# Patient Record
Sex: Male | Born: 1980 | Race: White | Hispanic: No | Marital: Single | Smoking: Current every day smoker
Health system: Southern US, Community
[De-identification: ages and names within clinical notes are randomized; demographics above are authoritative.]

## PROBLEM LIST (undated history)

## (undated) DIAGNOSIS — M5020 Other cervical disc displacement, unspecified cervical region: Secondary | ICD-10-CM

## (undated) DIAGNOSIS — F419 Anxiety disorder, unspecified: Secondary | ICD-10-CM

## (undated) HISTORY — DX: Anxiety disorder, unspecified: F41.9

## (undated) HISTORY — PX: CERVICAL FUSION: SHX112

## (undated) HISTORY — PX: SPINE SURGERY: SHX786

---

## 2012-12-21 ENCOUNTER — Ambulatory Visit: Payer: Self-pay | Admitting: Family Medicine

## 2012-12-21 LAB — URINALYSIS, COMPLETE
Blood: NEGATIVE
Glucose,UR: NEGATIVE mg/dL (ref 0–75)
Ketone: NEGATIVE
Ph: 8.5 (ref 4.5–8.0)
Protein: NEGATIVE
Specific Gravity: 1.01 (ref 1.003–1.030)

## 2012-12-21 LAB — COMPREHENSIVE METABOLIC PANEL
Albumin: 4.8 g/dL (ref 3.4–5.0)
Alkaline Phosphatase: 55 U/L (ref 50–136)
Anion Gap: 16 (ref 7–16)
Bilirubin,Total: 1 mg/dL (ref 0.2–1.0)
Chloride: 102 mmol/L (ref 98–107)
Co2: 22 mmol/L (ref 21–32)
Creatinine: 1.13 mg/dL (ref 0.60–1.30)
EGFR (African American): >60
Glucose: 108 mg/dL — ABNORMAL HIGH (ref 65–99)
Osmolality: 280 (ref 275–301)
Potassium: 3.5 mmol/L (ref 3.5–5.1)
SGOT(AST): 20 U/L (ref 15–37)
SGPT (ALT): 24 U/L (ref 12–78)
Total Protein: 7.9 g/dL (ref 6.4–8.2)

## 2012-12-21 LAB — CBC WITH DIFFERENTIAL/PLATELET
Basophil #: 0 10*3/uL (ref 0.0–0.1)
Basophil %: 0.2 %
Eosinophil #: 0 10*3/uL (ref 0.0–0.7)
Eosinophil %: 0.3 %
Lymphocyte #: 0.6 10*3/uL — ABNORMAL LOW (ref 1.0–3.6)
MCH: 32.6 pg (ref 26.0–34.0)
MCHC: 35.1 g/dL (ref 32.0–36.0)
MCV: 93 fL (ref 80–100)
Monocyte #: 0.4 x10 3/mm (ref 0.2–1.0)
RBC: 5.2 10*6/uL (ref 4.40–5.90)
RDW: 12.9 % (ref 11.5–14.5)
WBC: 8.4 10*3/uL (ref 3.8–10.6)

## 2015-04-02 DIAGNOSIS — M47812 Spondylosis without myelopathy or radiculopathy, cervical region: Secondary | ICD-10-CM | POA: Insufficient documentation

## 2015-06-20 DIAGNOSIS — S02402A Zygomatic fracture, unspecified, initial encounter for closed fracture: Secondary | ICD-10-CM | POA: Insufficient documentation

## 2015-12-03 ENCOUNTER — Other Ambulatory Visit: Payer: Self-pay | Admitting: Neurosurgery

## 2015-12-03 ENCOUNTER — Ambulatory Visit
Admission: RE | Admit: 2015-12-03 | Discharge: 2015-12-03 | Disposition: A | Discharge status: Home / Self Care | Payer: Self-pay | Source: Ambulatory Visit | Attending: Neurosurgery | Admitting: Neurosurgery

## 2015-12-03 DIAGNOSIS — Y92009 Unspecified place in unspecified non-institutional (private) residence as the place of occurrence of the external cause: Principal | ICD-10-CM

## 2015-12-03 DIAGNOSIS — W19XXXS Unspecified fall, sequela: Secondary | ICD-10-CM

## 2015-12-26 ENCOUNTER — Ambulatory Visit
Admission: EM | Admit: 2015-12-26 | Discharge: 2015-12-26 | Disposition: A | Discharge status: Home / Self Care | Payer: 59

## 2015-12-26 DIAGNOSIS — W57XXXA Bitten or stung by nonvenomous insect and other nonvenomous arthropods, initial encounter: Secondary | ICD-10-CM | POA: Diagnosis not present

## 2015-12-26 DIAGNOSIS — L03317 Cellulitis of buttock: Secondary | ICD-10-CM

## 2015-12-26 DIAGNOSIS — T148 Other injury of unspecified body region: Secondary | ICD-10-CM | POA: Diagnosis not present

## 2015-12-26 DIAGNOSIS — T07XXXA Unspecified multiple injuries, initial encounter: Secondary | ICD-10-CM

## 2015-12-26 MED ORDER — CEPHALEXIN 250 MG PO CAPS: 250.0000 mg | ORAL_CAPSULE | Freq: Four times a day (QID) | ORAL | 0 refills | Status: DC

## 2015-12-26 MED ORDER — SULFAMETHOXAZOLE-TRIMETHOPRIM 800-160 MG PO TABS
1.0000 | ORAL_TABLET | Freq: Two times a day (BID) | ORAL | 0 refills | Status: DC
Start: 2015-12-26 — End: 2022-03-05

## 2015-12-26 MED ORDER — MUPIROCIN CALCIUM 2 % EX CREA: 1.0000 "application " | TOPICAL_CREAM | Freq: Two times a day (BID) | CUTANEOUS | 0 refills | Status: DC

## 2015-12-26 NOTE — ED Provider Notes (Signed)
CSN: [id number]     Arrival date & time 12/26/15  1346 History   None    Chief Complaint  Patient presents with  . Insect Bite    right buttock   (Consider location/radiation/quality/duration/timing/severity/associated sxs/prior Treatment) 35 yr old white male presents to Er with 1 day hx of possible bug bite unknown cause. Pt states he noticed white bump to right buttock last night. Denies fever, chills. + mild itching to area. No treatment PTA. Pt requesting work note.    The history is provided by the patient. No language interpreter was used.    History reviewed. No pertinent past medical history. Past Surgical History:  Procedure Laterality Date  . CERVICAL FUSION     C6-C7   History reviewed. No pertinent family history. Social History  Substance Use Topics  . Smoking status: Never Smoker  . Smokeless tobacco: Never Used  . Alcohol use No    Review of Systems  Constitutional: Negative.   HENT: Negative.   Eyes: Negative.   Respiratory: Negative.   Cardiovascular: Negative.   Gastrointestinal: Negative.   Endocrine: Negative.   Musculoskeletal: Negative.   Skin: Positive for color change and wound. Negative for pallor and rash.  Allergic/Immunologic: Negative.   Neurological: Negative.   Psychiatric/Behavioral: Negative.   All other systems reviewed and are negative.   Allergies  Review of patient's allergies indicates no known allergies.  Home Medications   Prior to Admission medications   Medication Sig Start Date End Date Taking? Authorizing Provider  diazepam (VALIUM) 10 MG tablet Take 10 mg by mouth every 6 (six) hours as needed for anxiety.   Yes Historical Provider, MD  cephALEXin (KEFLEX) 250 MG capsule Take 1 capsule (250 mg total) by mouth 4 (four) times daily. 12/26/15   Clancy Gourd, NP  mupirocin cream (BACTROBAN) 2 % Apply 1 application topically 2 (two) times daily. 12/26/15   Clancy Gourd, NP  oxyCODONE-acetaminophen (PERCOCET) 10-325  MG tablet Take 1 tablet by mouth every 4 (four) hours as needed for pain.    Historical Provider, MD  sulfamethoxazole-trimethoprim (BACTRIM DS,SEPTRA DS) 800-160 MG tablet Take 1 tablet by mouth 2 (two) times daily. 12/26/15   Clancy Gourd, NP   Meds Ordered and Administered this Visit  Medications - No data to display  BP (!) 128/91 (BP Location: Left Arm)   Pulse 65   Temp 98.4 F (36.9 C) (Oral)   Resp 18   Ht  (1.854 m)   Wt 185 lb (83.9 kg)   SpO2 99%   BMI 24.41 kg/m  No data found.   Physical Exam  Constitutional: He appears well-developed and well-nourished. He is cooperative.  Visualized right buttock with chaperone assist by Celso Sickle.   HENT:  Head: Normocephalic.  Eyes: Conjunctivae and lids are normal. Pupils are equal, round, and reactive to light.  Neck: Trachea normal.  Cardiovascular: Normal rate, regular rhythm, normal heart sounds and normal pulses.   Pulmonary/Chest: Effort normal.  Abdominal: Normal appearance.  Neurological: He is alert. GCS eye subscore is 4. GCS verbal subscore is 5. GCS motor subscore is 6.  Skin: Skin is warm and dry. Capillary refill takes less than 2 seconds. Ecchymosis noted.     Psychiatric: He has a normal mood and affect. His speech is normal and behavior is normal.  Nursing note and vitals reviewed.   Urgent Care Course   Clinical Course    Procedures (including critical care time)  Labs Review Labs Reviewed - No  data to display  Imaging Review No results found.       MDM  DDX: spider bite, folliculitis, trauma.   Discussed with pt hx of smoking 1/2ppd of cigarettes, pt does not wish to stop at this time. Pt also has mildly elevated blood pressure(may be d/t discomfort of insect bite or UC visit), recommend recheck with PCP in 2 days for wound and BP. Pt verbalized understanding to this provider.    1. Insect bite   2. Multiple contusions   3. Cellulitis of buttock        Clancy GourdJeanette Dare Sanger,  NP 12/26/15 1604

## 2015-12-26 NOTE — Discharge Instructions (Signed)
Use medication as directed. Follow up in 2 days for recheck with your PCP or this UC. Return as needed.

## 2015-12-26 NOTE — ED Triage Notes (Signed)
Patient found a bug bit on his right buttock yesterday morning. Complains of itching and discomfort.

## 2017-06-03 ENCOUNTER — Ambulatory Visit
Admission: EM | Admit: 2017-06-03 | Discharge: 2017-06-03 | Disposition: A | Discharge status: Home / Self Care | Payer: 59 | Attending: Family Medicine | Admitting: Family Medicine

## 2017-06-03 ENCOUNTER — Other Ambulatory Visit: Payer: Self-pay

## 2017-06-03 ENCOUNTER — Encounter: Payer: Self-pay | Admitting: *Deleted

## 2017-06-03 DIAGNOSIS — R079 Chest pain, unspecified: Secondary | ICD-10-CM | POA: Insufficient documentation

## 2017-06-03 DIAGNOSIS — M940 Chondrocostal junction syndrome [Tietze]: Secondary | ICD-10-CM | POA: Diagnosis not present

## 2017-06-03 NOTE — ED Triage Notes (Signed)
Patient started having sudden severe left shoulder pain that radiated down into his left chest this am. Pain subsided 1.5 hours later and is now better.

## 2017-06-06 ENCOUNTER — Telehealth: Payer: Self-pay

## 2017-06-06 NOTE — Telephone Encounter (Signed)
Called to follow up with patient since visit here at [facility]. Patient instructed to call back with any questions or concerns. MAH  

## 2017-10-14 NOTE — ED Provider Notes (Signed)
[facility]    CSN: [id number] Arrival date & time: 06/03/17  1342     History   Chief Complaint Chief Complaint  Patient presents with  . Shoulder Pain    HPI Chad Duncan is a 37 y.o. male.   37 yo male with a c/o left shoulder pain that seems to radiate down into his chest. Denies any fall, injuries, trauma, shortness of breath, fevers, chills.   The history is provided by the patient.  Shoulder Pain    History reviewed. No pertinent past medical history.  There are no active problems to display for this patient.   Past Surgical History:  Procedure Laterality Date  . CERVICAL FUSION     C6-C7       Home Medications    Prior to Admission medications   Medication Sig Start Date End Date Taking? Authorizing Provider  cephALEXin (KEFLEX) 250 MG capsule Take 1 capsule (250 mg total) by mouth 4 (four) times daily. 12/26/15   Defelice, Para March, NP  diazepam (VALIUM) 10 MG tablet Take 10 mg by mouth every 6 (six) hours as needed for anxiety.    [provider]  mupirocin cream (BACTROBAN) 2 % Apply 1 application topically 2 (two) times daily. 12/26/15   Defelice, Para March, NP  oxyCODONE-acetaminophen (PERCOCET) 10-325 MG tablet Take 1 tablet by mouth every 4 (four) hours as needed for pain.    [provider]  sulfamethoxazole-trimethoprim (BACTRIM DS,SEPTRA DS) 800-160 MG tablet Take 1 tablet by mouth 2 (two) times daily. 12/26/15   Defelice, Para March, NP    Family History Family History  Problem Relation Age of Onset  . Healthy Mother   . Hypertension Father     Social History Social History   Tobacco Use  . Smoking status: Current Every Day Smoker    Packs/day: 0.50    Types: Cigarettes  . Smokeless tobacco: Never Used  Substance Use Topics  . Alcohol use: No  . Drug use: No     Allergies   Patient has no known allergies.   Review of Systems Review of Systems   Physical Exam Triage Vital Signs ED Triage Vitals    Enc Vitals Group     BP 06/03/17 1400 (!) 145/96     Pulse Rate 06/03/17 1400 68     Resp 06/03/17 1400 16     Temp 06/03/17 1400 98.9 F (37.2 C)     Temp Source 06/03/17 1400 Oral     SpO2 06/03/17 1400 100 %     Weight 06/03/17 1401 185 lb (83.9 kg)     Height 06/03/17 1401  (1.854 m)     Head Circumference --      Peak Flow --      Pain Score 06/03/17 1401 0     Pain Loc --      Pain Edu? --      Excl. in GC? --    No data found.  Updated Vital Signs BP (!) 145/96 (BP Location: Right Arm)   Pulse 68   Temp 98.9 F (37.2 C) (Oral)   Resp 16   Ht  (1.854 m)   Wt 185 lb (83.9 kg)   SpO2 100%   BMI 24.41 kg/m   Visual Acuity Right Eye Distance:   Left Eye Distance:   Bilateral Distance:    Right Eye Near:   Left Eye Near:    Bilateral Near:     Physical Exam  Constitutional: He  appears well-developed and well-nourished. No distress.  HENT:  Head: Normocephalic and atraumatic.  Right Ear: Tympanic membrane, external ear and ear canal normal.  Left Ear: Tympanic membrane, external ear and ear canal normal.  Nose: Nose normal.  Mouth/Throat: Uvula is midline, oropharynx is clear and moist and mucous membranes are normal. No oropharyngeal exudate or tonsillar abscesses.  Eyes: Pupils are equal, round, and reactive to light. Conjunctivae and EOM are normal. Right eye exhibits no discharge. Left eye exhibits no discharge. No scleral icterus.  Neck: Normal range of motion. Neck supple. No tracheal deviation present. No thyromegaly present.  Cardiovascular: Normal rate, regular rhythm and normal heart sounds.  Pulmonary/Chest: Effort normal and breath sounds normal. No stridor. No respiratory distress. He has no wheezes. He has no rales. He exhibits tenderness (reproducible, left upper chest).  Lymphadenopathy:    He has no cervical adenopathy.  Neurological: He is alert.  Skin: Skin is warm and dry. No rash noted. He is not diaphoretic.  Nursing note and  vitals reviewed.    UC Treatments / Results  Labs (all labs ordered are listed, but only abnormal results are displayed) Labs Reviewed - No data to display  EKG None  Radiology No results found.  Procedures ED EKG Date/Time: 10/14/2017 5:10 PM Performed by: Payton Mccallum, MD Authorized by: Payton Mccallum, MD   ECG reviewed by ED Physician in the absence of a cardiologist: yes   Previous ECG:    Previous ECG:  Unavailable Interpretation:    Interpretation: normal   Rate:    ECG rate assessment: normal   Rhythm:    Rhythm: sinus rhythm   Ectopy:    Ectopy: none   QRS:    QRS axis:  Normal Conduction:    Conduction: normal   ST segments:    ST segments:  Normal T waves:    T waves: normal     (including critical care time)  Medications Ordered in UC Medications - No data to display  Initial Impression / Assessment and Plan / UC Course  I have reviewed the triage vital signs and the nursing notes.  Pertinent labs & imaging results that were available during my care of the patient were reviewed by me and considered in my medical decision making (see chart for details).      Final Clinical Impressions(s) / UC Diagnoses   Final diagnoses:  Costochondritis   Discharge Instructions   None    ED Prescriptions    None     1. diagnosis reviewed with patient 2 Recommend supportive treatment with rest, ice, otc analgesics prn 3. Follow-up prn if symptoms worsen or don't improve   Controlled Substance Prescriptions [place] Controlled Substance Registry consulted? Not Applicable   Payton Mccallum, MD 10/14/17 [phone]

## 2019-03-21 ENCOUNTER — Ambulatory Visit (INDEPENDENT_AMBULATORY_CARE_PROVIDER_SITE_OTHER): Payer: 59

## 2019-03-21 ENCOUNTER — Encounter: Payer: Self-pay | Admitting: Emergency Medicine

## 2019-03-21 ENCOUNTER — Other Ambulatory Visit: Payer: Self-pay

## 2019-03-21 ENCOUNTER — Ambulatory Visit
Admission: EM | Admit: 2019-03-21 | Discharge: 2019-03-21 | Disposition: A | Discharge status: Home / Self Care | Payer: 59 | Attending: Family Medicine | Admitting: Family Medicine

## 2019-03-21 DIAGNOSIS — M542 Cervicalgia: Secondary | ICD-10-CM

## 2019-03-21 DIAGNOSIS — S161XXA Strain of muscle, fascia and tendon at neck level, initial encounter: Secondary | ICD-10-CM | POA: Diagnosis not present

## 2019-03-21 DIAGNOSIS — X500XXA Overexertion from strenuous movement or load, initial encounter: Secondary | ICD-10-CM | POA: Diagnosis not present

## 2019-03-21 HISTORY — DX: Other cervical disc displacement, unspecified cervical region: M50.20

## 2019-03-21 MED ORDER — BACLOFEN 5 MG PO TABS: 5.0000 mg | ORAL_TABLET | Freq: Three times a day (TID) | ORAL | 0 refills | Status: DC

## 2019-03-21 MED ORDER — HYDROCODONE-ACETAMINOPHEN 5-325 MG PO TABS: ORAL_TABLET | ORAL | 0 refills | Status: DC

## 2019-03-21 NOTE — ED Provider Notes (Signed)
[facility]    CSN: [id number] Arrival date & time: 03/21/19  1300      History   Chief Complaint Chief Complaint  Patient presents with  . Neck Pain    HPI Chad Duncan is a 38 y.o. male.   38 yo male with a c/o sudden neck pain this morning when he was moving his neck. States he was typing on his keyboard and looked up at the computer screen when he felt a sudden sharp shooting pain. States he has a h/o herniated cervical discs and has had a C6-7 cervical fusion. Patient states he has an appointment later this week with his spine specialist. Denies any numbness/tingling, weakness.    Neck Pain   Past Medical History:  Diagnosis Date  . Herniated disc, cervical     There are no active problems to display for this patient.   Past Surgical History:  Procedure Laterality Date  . CERVICAL FUSION     C6-C7       Home Medications    Prior to Admission medications   Medication Sig Start Date End Date Taking? Authorizing Provider  baclofen 5 MG TABS Take 5 mg by mouth 3 (three) times daily. 03/21/19   Payton Mccallum, MD  cephALEXin (KEFLEX) 250 MG capsule Take 1 capsule (250 mg total) by mouth 4 (four) times daily. 12/26/15   Defelice, Para March, NP  diazepam (VALIUM) 10 MG tablet Take 10 mg by mouth every 6 (six) hours as needed for anxiety.    [provider]  HYDROcodone-acetaminophen (NORCO/VICODIN) 5-325 MG tablet 1-2 tabs po bid prn 03/21/19   Payton Mccallum, MD  mupirocin cream (BACTROBAN) 2 % Apply 1 application topically 2 (two) times daily. 12/26/15   Defelice, Para March, NP  oxyCODONE-acetaminophen (PERCOCET) 10-325 MG tablet Take 1 tablet by mouth every 4 (four) hours as needed for pain.    [provider]  sulfamethoxazole-trimethoprim (BACTRIM DS,SEPTRA DS) 800-160 MG tablet Take 1 tablet by mouth 2 (two) times daily. 12/26/15   Defelice, Para March, NP    Family History Family History  Problem Relation Age of Onset  . Healthy Mother    . Hypertension Father     Social History Social History   Tobacco Use  . Smoking status: Current Every Day Smoker    Packs/day: 0.50    Years: 18.00    Pack years: 9.00    Types: Cigarettes  . Smokeless tobacco: Never Used  Substance Use Topics  . Alcohol use: No  . Drug use: No     Allergies   Patient has no known allergies.   Review of Systems Review of Systems  Musculoskeletal: Positive for neck pain.     Physical Exam Triage Vital Signs ED Triage Vitals  Enc Vitals Group     BP 03/21/19 1326 (!) 177/107     Pulse Rate 03/21/19 1326 64     Resp 03/21/19 1326 18     Temp 03/21/19 1326 98.1 F (36.7 C)     Temp Source 03/21/19 1326 Oral     SpO2 03/21/19 1326 100 %     Weight 03/21/19 1326 185 lb (83.9 kg)     Height 03/21/19 1326 6\' 1"  (1.854 m)     Head Circumference --      Peak Flow --      Pain Score 03/21/19 1324 10     Pain Loc --      Pain Edu? --      Excl. in  GC? --    No data found.  Updated Vital Signs BP (!) 177/107 (BP Location: Right Arm)   Pulse 64   Temp 98.1 F (36.7 C) (Oral)   Resp 18   Ht 6\' 1"  (1.854 m)   Wt 83.9 kg   SpO2 100%   BMI 24.41 kg/m   Visual Acuity Right Eye Distance:   Left Eye Distance:   Bilateral Distance:    Right Eye Near:   Left Eye Near:    Bilateral Near:     Physical Exam Vitals signs and nursing note reviewed.  Constitutional:      General: He is not in acute distress.    Appearance: He is not toxic-appearing or diaphoretic.  Neck:     Musculoskeletal: Muscular tenderness (over the cervical paraspinous muscles (left greater than right)) present.  Neurological:     General: No focal deficit present.     Mental Status: He is alert.      UC Treatments / Results  Labs (all labs ordered are listed, but only abnormal results are displayed) Labs Reviewed - No data to display  EKG   Radiology Dg Cervical Spine Complete  Result Date: 03/21/2019 CLINICAL DATA:  Pain, previous fusion  EXAM: CERVICAL SPINE - COMPLETE 4+ VIEW COMPARISON:  Radiograph 2017 FINDINGS: The patient is status post ACDF at C6-C7. No periprosthetic lucency or fracture is identified. Disc height loss with anterior osteophytes is seen at C5-C6. This does not appear significantly changed from the prior exam of 2017. No prevertebral soft tissue swelling. IMPRESSION: ACDF at C6-C7 without hardware complication. 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 Electronically Signed   By: Prudencio Pair M.D.   On: 03/21/2019 14:38    Procedures Procedures (including critical care time)  Medications Ordered in UC Medications - No data to display  Initial Impression / Assessment and Plan / UC Course  I have reviewed the triage vital signs and the nursing notes.  Pertinent labs & imaging results that were available during my care of the patient were reviewed by me and considered in my medical decision making (see chart for details).     Final Clinical Impressions(s) / UC Diagnoses   Final diagnoses:  Acute strain of neck muscle, initial encounter  Neck pain     Discharge Instructions     Heat to area Follow up with spine specialist this week as scheduled    ED Prescriptions    Medication Sig Dispense Auth. Provider   baclofen 5 MG TABS Take 5 mg by mouth 3 (three) times daily. 15 tablet Norval Gable, MD   HYDROcodone-acetaminophen (NORCO/VICODIN) 5-325 MG tablet 1-2 tabs po bid prn 10 tablet Norval Gable, MD      1. x-ray results and diagnosis reviewed with patient 2. rx as per orders above; reviewed possible side effects, interactions, risks and benefits  3. Recommend supportive treatment as above 4. Follow-up prn if symptoms worsen or don't improve  I have reviewed the PDMP during this encounter.   Norval Gable, MD 03/21/19 [phone]

## 2019-03-21 NOTE — Discharge Instructions (Signed)
Heat to area Follow up with spine specialist this week as scheduled

## 2019-03-21 NOTE — ED Triage Notes (Signed)
Patient in today c/o neck pain since this morning. Patient has had cervical fusion at C6, C7. No injury noted this morning. Patient went from typing on his keyboard and then looked up at the screen and had extreme pain. Patient has appointment with his surgeon on 03/24/19.

## 2019-03-28 ENCOUNTER — Other Ambulatory Visit: Payer: Self-pay | Admitting: Neurosurgery

## 2019-03-28 ENCOUNTER — Telehealth: Payer: Self-pay | Admitting: *Deleted

## 2019-03-28 DIAGNOSIS — M542 Cervicalgia: Secondary | ICD-10-CM

## 2019-04-07 ENCOUNTER — Ambulatory Visit
Admission: RE | Admit: 2019-04-07 | Discharge: 2019-04-07 | Disposition: A | Discharge status: Home / Self Care | Payer: 59 | Source: Ambulatory Visit | Attending: Neurosurgery | Admitting: Neurosurgery

## 2019-04-07 ENCOUNTER — Other Ambulatory Visit: Payer: Self-pay

## 2019-04-07 DIAGNOSIS — M542 Cervicalgia: Secondary | ICD-10-CM

## 2019-06-21 ENCOUNTER — Ambulatory Visit: Payer: Self-pay | Attending: Internal Medicine

## 2019-06-21 DIAGNOSIS — Z20822 Contact with and (suspected) exposure to covid-19: Secondary | ICD-10-CM | POA: Insufficient documentation

## 2019-06-22 LAB — NOVEL CORONAVIRUS, NAA: SARS-CoV-2, NAA: NOT DETECTED

## 2020-08-05 IMAGING — CT CT CERVICAL SPINE W/O CM
3 of 5 series · 11 of 33 positions shown, 13 images · non-contrast
Comparison: Face CT 12/03/2015. Cervical spine radiographs
12/03/2015.

CLINICAL DATA: 38-year-old male with prior cervical fusion.
Recurrent pain x3 weeks radiating down the left side of the
shoulder. Neck tightness.

EXAM:
CT CERVICAL SPINE WITHOUT CONTRAST
TECHNIQUE: Multidetector CT imaging of the cervical spine was performed without
intravenous contrast. Multiplanar CT image reconstructions were also
generated.

[Series 2: axial bone c-spine 2.00 · axial · 0.38mm/px · z∈[-669,-563]mm · 3 of 107 slices shown, 4 images]
[im 27/107  soft-tissue]
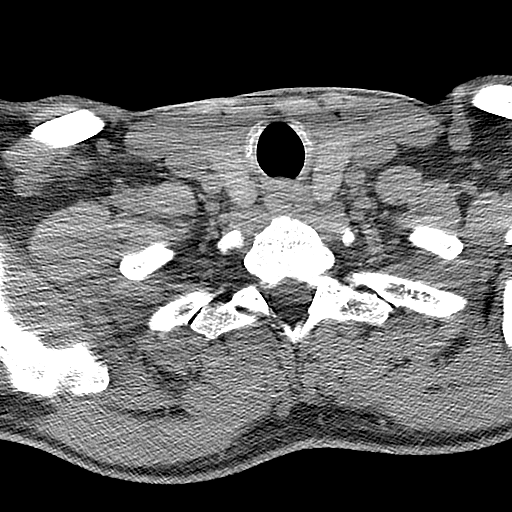
[im 27/107  bone]
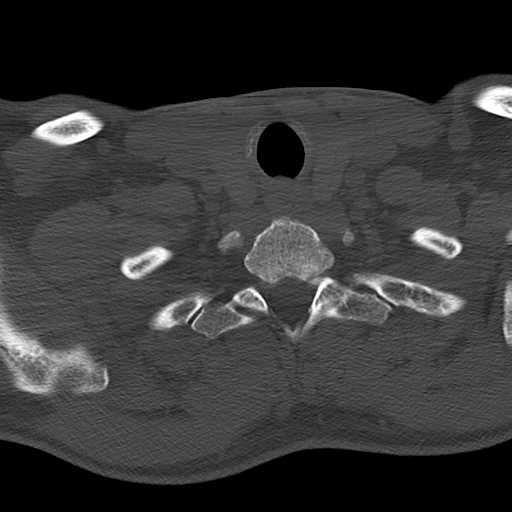
[im 54/107  bone]
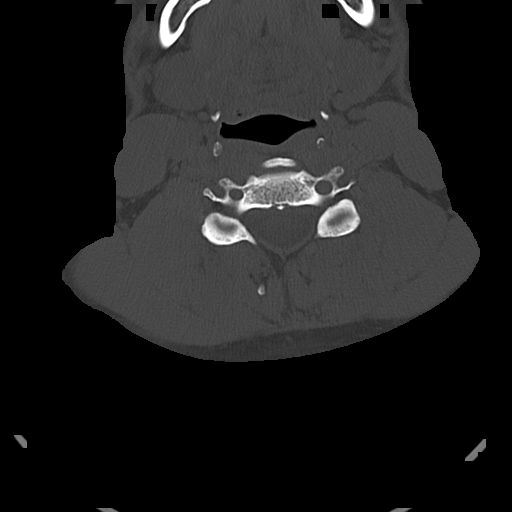
[im 80/107  bone]
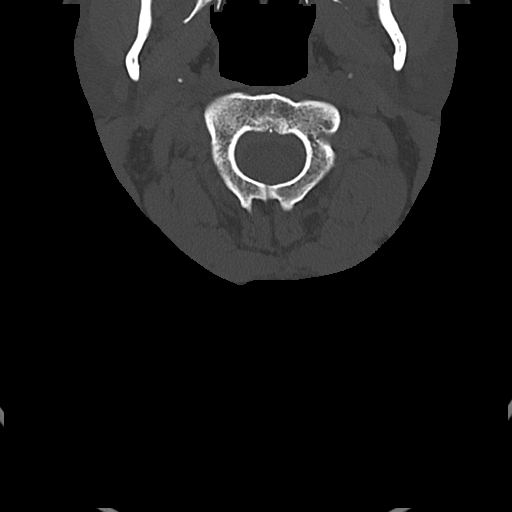

[Series 4: sag bone c-spine 2.00 sag · sagittal · 0.27mm/px · 5 of 98 slices shown, 6 images]
[im 33/98  bone]
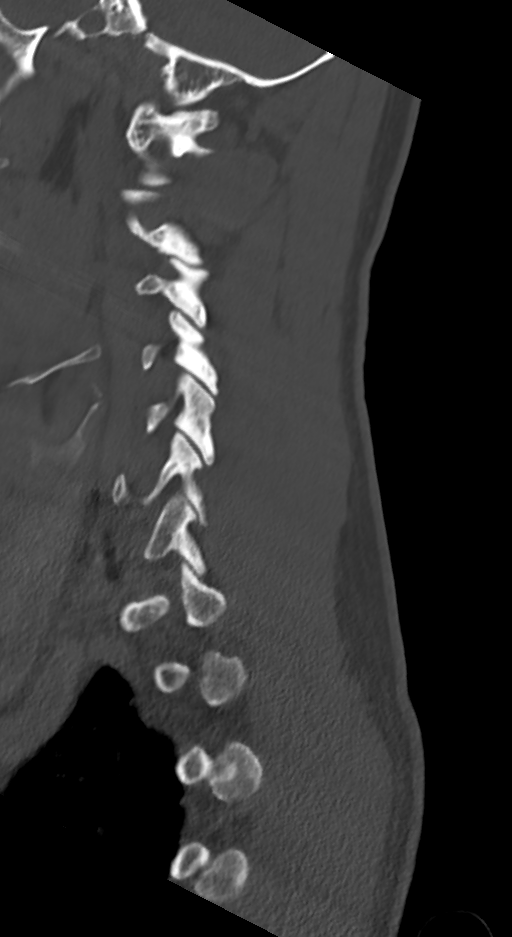
[im 41/98  bone]
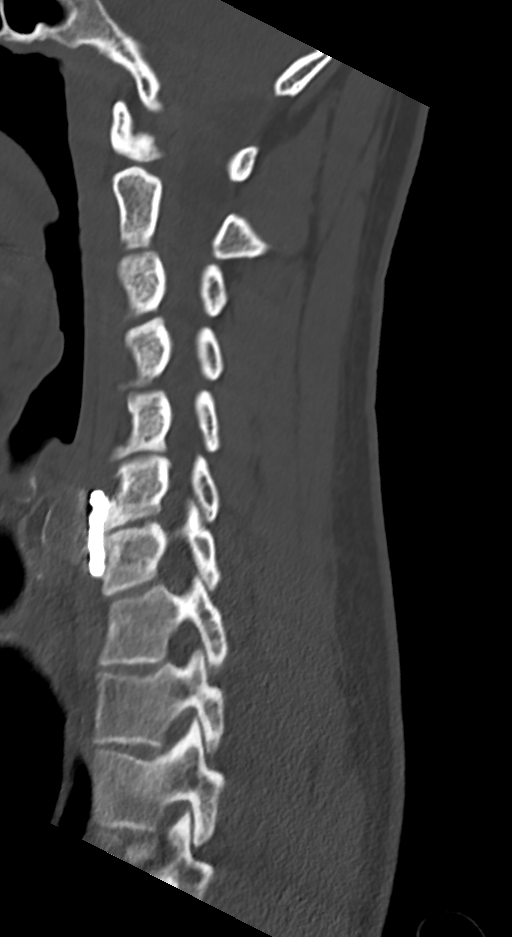
[im 49/98  soft-tissue]
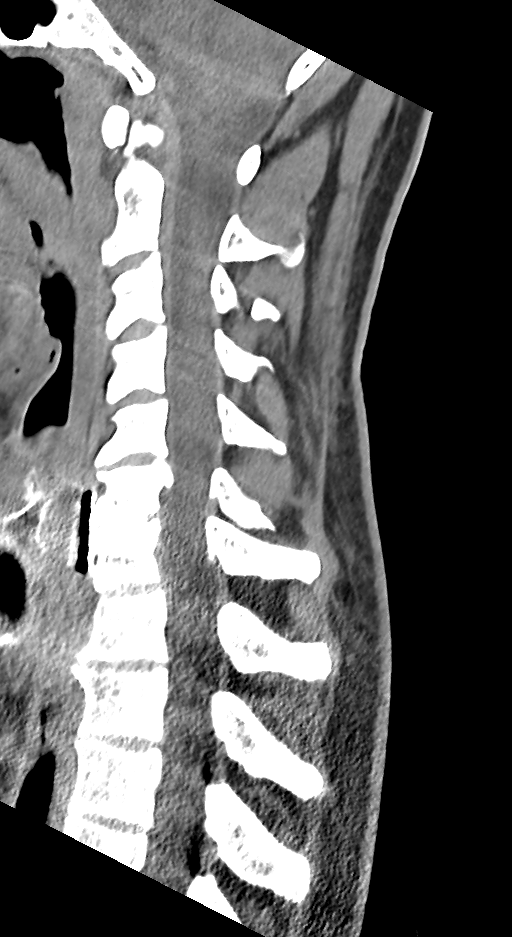
[im 49/98  bone]
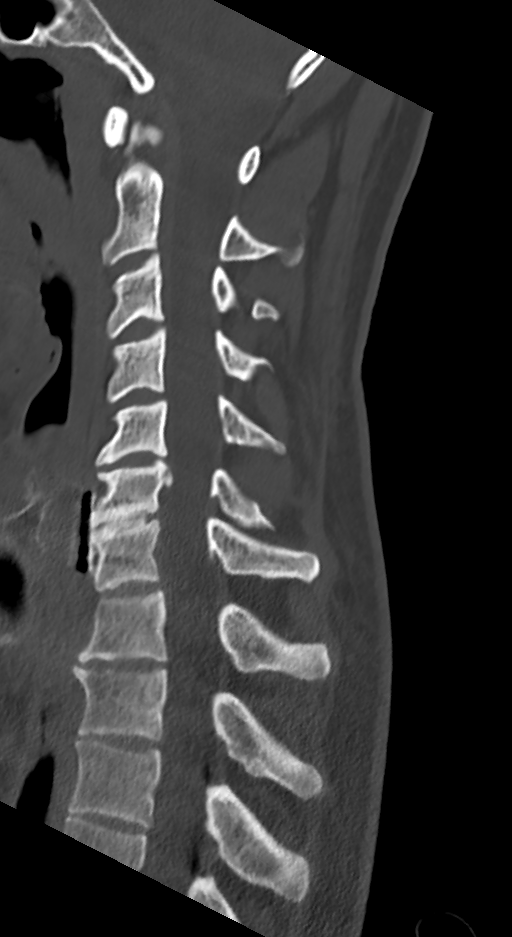
[im 57/98  bone]
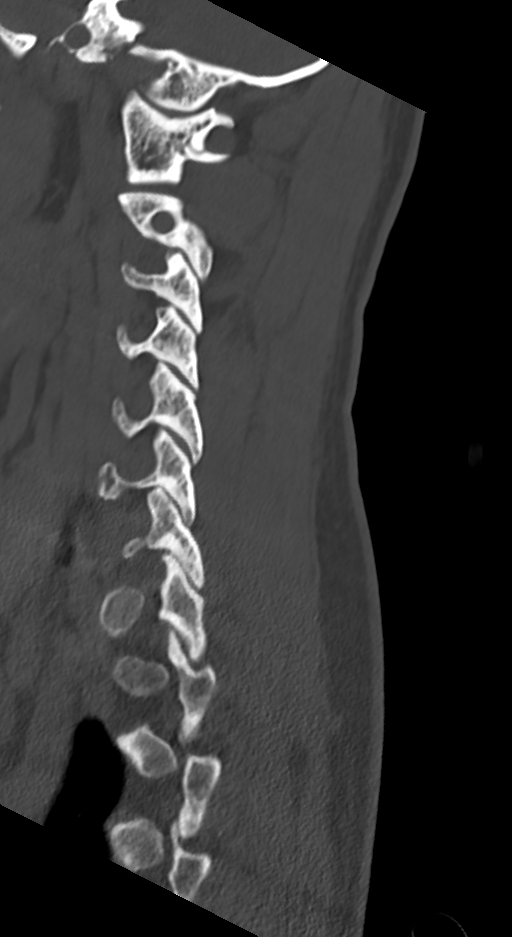
[im 65/98  bone]
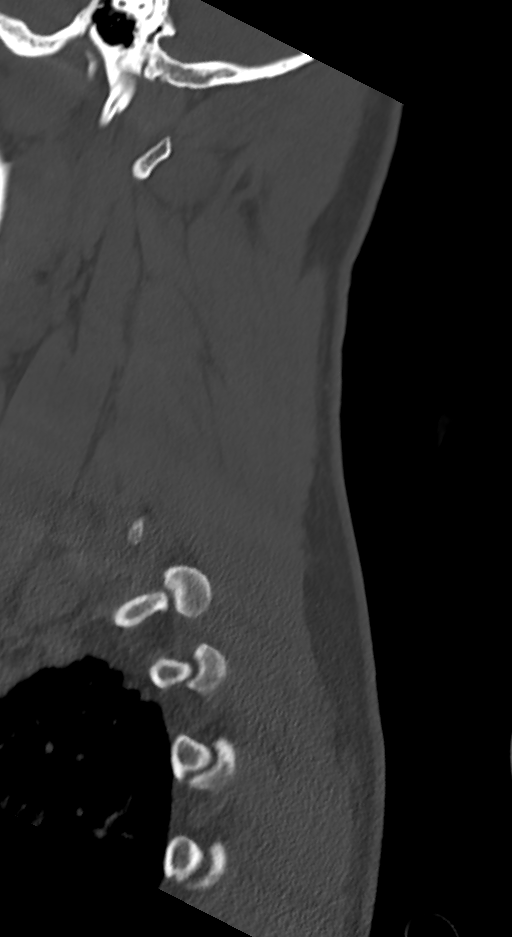

[Series 6: cor bone c-spine 2.00 cor · coronal · 0.39mm/px · 3 of 68 slices shown]
[im 18/68  bone]
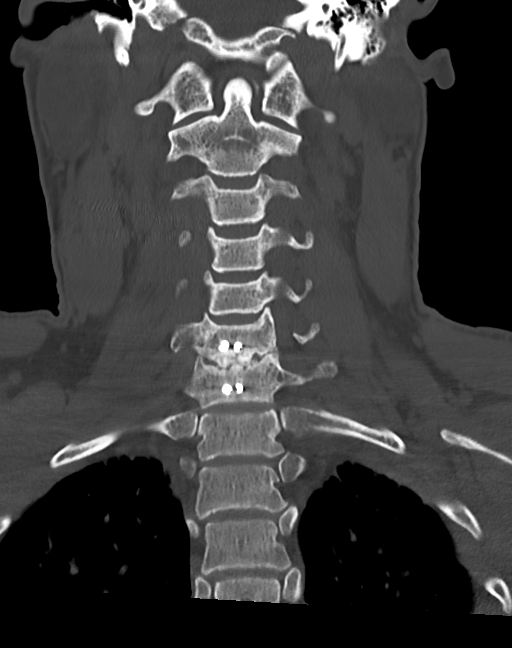
[im 29/68  bone]
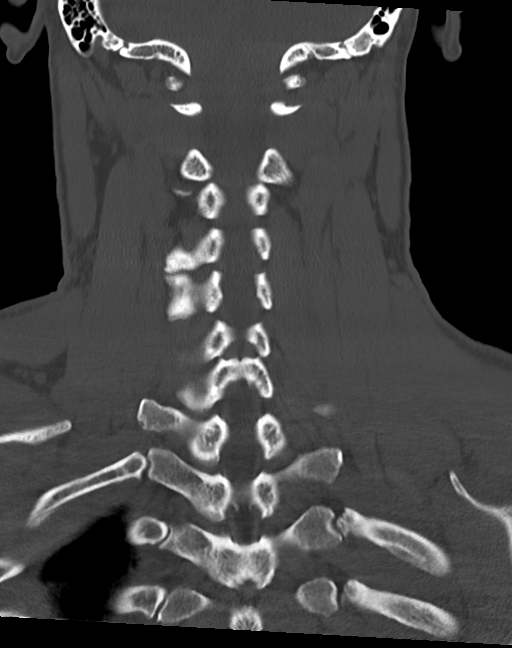
[im 39/68  bone]
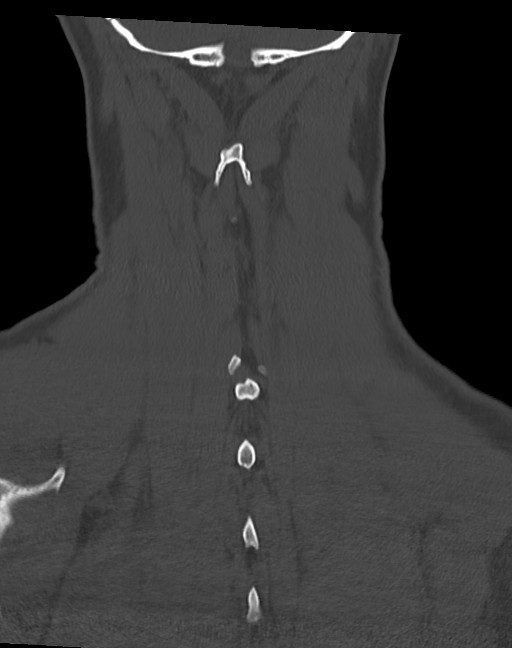

[11 of 33 positions shown; findings below may reference images not displayed]

FINDINGS: Alignment: Chronic straightening of cervical lordosis.
Cervicothoracic junction alignment is within normal limits.
Bilateral posterior element alignment is within normal limits.

Skull base and vertebrae: Visualized skull base is intact. No
atlanto-occipital dissociation. No acute osseous abnormality
identified.

Soft tissues and spinal canal: No prevertebral fluid or swelling. No
visible canal hematoma. Negative noncontrast neck soft tissues.

Disc levels:

C2-C3:  Negative.

C3-C4:  Mild right facet hypertrophy. Otherwise negative.

C4-C5:  Negative.

C5-C6: Disc space loss with circumferential disc bulge and endplate
spurring. Possible mild spinal stenosis. No convincing foraminal
stenosis.

C6-C7: Prior ACDF. Hardware appears intact and without evidence of
loosening. This level does lack interbody arthrodesis posteriorly
(series 4, image 46), but there seems to be solid anterior interbody
arthrodesis such as on sagittal image 45 and coronal image 14. A
similar appearance is noted on the 1419 radiographs. Mild facet
hypertrophy. Mild if any bilateral C7 foraminal stenosis.

C7-T1:  Negative.

Upper chest: Visible upper thoracic levels appear intact. Negative
lung apices. Negative noncontrast thoracic inlet.

Other: Negative visible posterior fossa. Visible mastoid air cells
and tympanic cavities are clear.
IMPRESSION: 1. Prior C6-C7 ACDF with suspected solid anterior interbody
arthrodesis although posterior interbody space arthrodesis is
lacking. Mild if any bilateral C7 foraminal stenosis, and no other
adverse features.
2. Adjacent segment disease at C5-C6 with possible mild spinal
stenosis.
3. No acute osseous abnormality in the cervical spine.

## 2021-04-24 DIAGNOSIS — J101 Influenza due to other identified influenza virus with other respiratory manifestations: Secondary | ICD-10-CM | POA: Diagnosis not present

## 2022-03-05 ENCOUNTER — Ambulatory Visit
Admission: EM | Admit: 2022-03-05 | Discharge: 2022-03-05 | Disposition: A | Discharge status: Home / Self Care | Payer: BC Managed Care – PPO | Attending: Family Medicine | Admitting: Family Medicine

## 2022-03-05 ENCOUNTER — Encounter: Payer: Self-pay | Admitting: Emergency Medicine

## 2022-03-05 DIAGNOSIS — M62838 Other muscle spasm: Secondary | ICD-10-CM | POA: Insufficient documentation

## 2022-03-05 DIAGNOSIS — I10 Essential (primary) hypertension: Secondary | ICD-10-CM | POA: Diagnosis not present

## 2022-03-05 DIAGNOSIS — Z789 Other specified health status: Secondary | ICD-10-CM | POA: Diagnosis not present

## 2022-03-05 LAB — BASIC METABOLIC PANEL
Anion gap: 6 (ref 5–15)
BUN: 13 mg/dL (ref 6–20)
CO2: 25 mmol/L (ref 22–32)
Calcium: 8.8 mg/dL — ABNORMAL LOW (ref 8.9–10.3)
Chloride: 99 mmol/L (ref 98–111)
Creatinine, Ser: 1.01 mg/dL (ref 0.61–1.24)
GFR, Estimated: >60 mL/min (ref >60)
Glucose, Bld: 105 mg/dL — ABNORMAL HIGH (ref 70–99)
Potassium: 4 mmol/L (ref 3.5–5.1)
Sodium: 130 mmol/L — ABNORMAL LOW (ref 135–145)

## 2022-03-05 MED ORDER — HYDROCHLOROTHIAZIDE 12.5 MG PO TABS: 25.0000 mg | ORAL_TABLET | Freq: Every day | ORAL | 1 refills | Status: DC

## 2022-03-05 MED ORDER — BACLOFEN 5 MG PO TABS: 5.0000 mg | ORAL_TABLET | Freq: Three times a day (TID) | ORAL | 0 refills | Status: DC

## 2022-03-05 MED ORDER — AMLODIPINE BESYLATE 10 MG PO TABS: 10.0000 mg | ORAL_TABLET | Freq: Every day | ORAL | 1 refills | Status: DC

## 2022-03-05 NOTE — ED Triage Notes (Signed)
Pt was at the dentist office today and advised his blood pressure was elevated. The reading was 158/120 pt denies any symptoms. He does report having hypertension in the past but was never put on medication.

## 2022-03-05 NOTE — ED Provider Notes (Signed)
[facility]    CSN: [id number] Arrival date & time: 03/05/22  1544      History   Chief Complaint Chief Complaint  Patient presents with   Hypertension    HPI Chad Duncan is a 41 y.o. male.   HPI  Chad Duncan presents for elevated blood pressures.  Went to dental appointment and was told his blood pressure was "way too high." Says his resting heart rate was high and BP was around maybe 158/126 or possibly higher.  He does not recall the exact numbers.. Has been told before that his blood pressure and heart rate were high like he had been working out. Endorses lightheadedness. Denies chest pain, palpitations, lower extremity edema, exertional dyspnea, headaches and vision changes. He does not monitor his blood pressure.   He drinks 3-4 beers at night on most nights of the week.  No alcohol drinking today.  Has history of spinal fusion.  Reports history and ongoing intermittent cervical paraspinal muscle spasms.  Says he previously was given medication for this.  Fever : no  Chills: no Sore throat: no   Cough: no Sputum: no Nasal congestion : no  Appetite: normal  Hydration: normal  Abdominal pain: no Nausea: no Vomiting: no Dysuria: no  Sleep disturbance: no Back Pain: no Headache: no   Past Medical History:  Diagnosis Date   Herniated disc, cervical     There are no problems to display for this patient.   Past Surgical History:  Procedure Laterality Date   CERVICAL FUSION     C6-C7       Home Medications    Prior to Admission medications   Medication Sig Start Date End Date Taking? Authorizing Provider  amLODipine (NORVASC) 10 MG tablet Take 1 tablet (10 mg total) by mouth daily. 03/05/22  Yes Kiylee Thoreson, DO  hydrochlorothiazide (HYDRODIURIL) 12.5 MG tablet Take 2 tablets (25 mg total) by mouth daily. 03/05/22  Yes Kano Heckmann, DO  Baclofen 5 MG TABS Take 5 mg by mouth 3 (three) times daily. 03/05/22   Titiana Severa, Seward Meth, DO  diazepam  (VALIUM) 10 MG tablet Take 10 mg by mouth every 6 (six) hours as needed for anxiety.    [provider]  mupirocin cream (BACTROBAN) 2 % Apply 1 application topically 2 (two) times daily. 12/26/15   Defelice, Para March, NP  oxyCODONE-acetaminophen (PERCOCET) 10-325 MG tablet Take 1 tablet by mouth every 4 (four) hours as needed for pain.    [provider]    Family History Family History  Problem Relation Age of Onset   Healthy Mother    Hypertension Father     Social History Social History   Tobacco Use   Smoking status: Every Day    Packs/day: 0.50    Years: 18.00    Total pack years: 9.00    Types: Cigarettes   Smokeless tobacco: Never  Vaping Use   Vaping Use: Never used  Substance Use Topics   Alcohol use: Yes   Drug use: No     Allergies   Patient has no known allergies.   Review of Systems Review of Systems : negative unless otherwise stated in HPI.      Physical Exam Triage Vital Signs ED Triage Vitals  Enc Vitals Group     BP 03/05/22 1557 (!) 192/121     Pulse Rate 03/05/22 1557 69     Resp 03/05/22 1557 16     Temp 03/05/22 1557 98.2 F (36.8 C)  Temp src --      SpO2 03/05/22 1557 100 %     Weight --      Height --      Head Circumference --      Peak Flow --      Pain Score 03/05/22 1555 0     Pain Loc --      Pain Edu? --      Excl. in GC? --    No data found.  Updated Vital Signs BP (!) 192/121 (BP Location: Left Arm)   Pulse 69   Temp 98.2 F (36.8 C)   Resp 16   SpO2 100%   Visual Acuity Right Eye Distance:   Left Eye Distance:   Bilateral Distance:    Right Eye Near:   Left Eye Near:    Bilateral Near:     Physical Exam  GEN: alert, well appearing male, in no acute distress     HENT:  mucus membranes moist, no nasal discharge  EYES:   pupils equal and reactive, EOM intact NECK:  supple, normal ROM, no C-spine tenderness or current paraspinal tenderness RESP:  clear to auscultation bilaterally, no  increased work of breathing  CVS:   regular rate and rhythm, no murmur, distal pulses intact   EXT:   normal ROM, atraumatic, no LE edema  NEURO:  normal without focal findings,  speech normal, alert and oriented    Skin:   warm and dry, no rash Psych: Normal affect, appropriate speech and behavior    UC Treatments / Results  Labs (all labs ordered are listed, but only abnormal results are displayed) Labs Reviewed  BASIC METABOLIC PANEL - Abnormal; Notable for the following components:      Result Value   Sodium 130 (*)    Glucose, Bld 105 (*)    Calcium 8.8 (*)    All other components within normal limits    EKG   Radiology No results found.  Procedures Procedures (including critical care time)  Medications Ordered in UC Medications - No data to display  Initial Impression / Assessment and Plan / UC Course  I have reviewed the triage vital signs and the nursing notes.  Pertinent labs & imaging results that were available during my care of the patient were reviewed by me and considered in my medical decision making (see chart for details).     Patient is a 41 year old male who presents for elevated blood pressures at the dental office today.  He describes being told his blood pressure was "severely elevated" and his heart rate was elevated to the point as if he was working out at rest.  The blood pressure reported 150s/120s is likely not accurate.  BP today 192/121.  On chart review, as far back as 2019 patient does have elevated blood pressures systolically 170s to 140s /90s-107.  States he was never put any blood pressure medicine.  He does not have a primary care doctor but he is open to finding 1.  BMP obtained with stable serum creatinine.  Recommended starting blood pressure medicine today.  Risk and benefits discussed and patient would like to proceed with medication.  Start dual antihypertensive therapy with amlodipine and hydrochlorothiazide.  1 refill provided.  He is  to establish care with a PCP soon.  Patient reporting 3-4 alcoholic beverages on most nights of the week.  I am concerned that he may have an underlying alcohol use disorder.  Advised him to slowly cut back on  his alcohol use.  States that he will try.  Refilled baclofen for cervical more paraspinal muscle spasms.   Final Clinical Impressions(s) / UC Diagnoses   Final diagnoses:  Uncontrolled hypertension  Alcohol use  Cervical paraspinal muscle spasm     Discharge Instructions      See handouts on managing your blood pressure. Stop by the pharmacy to pick up your blood pressure medications and your muscle relaxers.   Take both blood pressure medications together daily. Please monitor your blood pressure at home first thing in the morning prior to eating and drinking and journal this for PCP follow-up. If you remain persistently elevated after your symptoms improve, you may need additional medical management. Other recommendations for high blood pressure are to decrease the amount of salt in your diet, exercise (150 minutes of moderate intensity exercise weekly), weight loss.  You should follow up with your primary doctor in 2-3 days regarding today's urgent care visit. If your symptoms persist, you may need a additional cardiovascular evaluation.  Go to the emergency department if you develop inability to keep down fluids, severe diarrhea, blood in your vomit or stools, worsening abdominal pain, fever over 100.4 F that is not resolved with Tylenol, severe chest pain, you pass out, or any additional symptoms that concern you.         ED Prescriptions     Medication Sig Dispense Auth. Provider   Baclofen 5 MG TABS Take 5 mg by mouth 3 (three) times daily. 30 tablet Jaimie Pippins, DO   amLODipine (NORVASC) 10 MG tablet Take 1 tablet (10 mg total) by mouth daily. 30 tablet Celes Dedic, DO   hydrochlorothiazide (HYDRODIURIL) 12.5 MG tablet Take 2 tablets (25 mg total) by mouth  daily. 30 tablet Lyndee Hensen, DO      PDMP not reviewed this encounter.   Lyndee Hensen, DO 03/05/22 1716

## 2022-03-05 NOTE — Discharge Instructions (Addendum)
See handouts on managing your blood pressure. Stop by the pharmacy to pick up your blood pressure medications and your muscle relaxers.   Take both blood pressure medications together daily. Please monitor your blood pressure at home first thing in the morning prior to eating and drinking and journal this for PCP follow-up. If you remain persistently elevated after your symptoms improve, you may need additional medical management. Other recommendations for high blood pressure are to decrease the amount of salt in your diet, exercise (150 minutes of moderate intensity exercise weekly), weight loss.  You should follow up with your primary doctor in 2-3 days regarding today's urgent care visit. If your symptoms persist, you may need a additional cardiovascular evaluation.  Go to the emergency department if you develop inability to keep down fluids, severe diarrhea, blood in your vomit or stools, worsening abdominal pain, fever over 100.4 F that is not resolved with Tylenol, severe chest pain, you pass out, or any additional symptoms that concern you.

## 2022-03-07 ENCOUNTER — Encounter: Payer: Self-pay | Admitting: Emergency Medicine

## 2022-05-08 ENCOUNTER — Encounter: Payer: Self-pay | Admitting: Family Medicine

## 2022-05-08 ENCOUNTER — Ambulatory Visit (INDEPENDENT_AMBULATORY_CARE_PROVIDER_SITE_OTHER): Payer: BC Managed Care – PPO | Admitting: Family Medicine

## 2022-05-08 VITALS — BP 178/120 | HR 88 | Ht 73.0 in | Wt 200.6 lb

## 2022-05-08 DIAGNOSIS — Z114 Encounter for screening for human immunodeficiency virus [HIV]: Secondary | ICD-10-CM | POA: Diagnosis not present

## 2022-05-08 DIAGNOSIS — F419 Anxiety disorder, unspecified: Secondary | ICD-10-CM

## 2022-05-08 DIAGNOSIS — R293 Abnormal posture: Secondary | ICD-10-CM | POA: Insufficient documentation

## 2022-05-08 DIAGNOSIS — R7989 Other specified abnormal findings of blood chemistry: Secondary | ICD-10-CM

## 2022-05-08 DIAGNOSIS — M6281 Muscle weakness (generalized): Secondary | ICD-10-CM | POA: Insufficient documentation

## 2022-05-08 DIAGNOSIS — Z13220 Encounter for screening for lipoid disorders: Secondary | ICD-10-CM

## 2022-05-08 DIAGNOSIS — M5412 Radiculopathy, cervical region: Secondary | ICD-10-CM | POA: Insufficient documentation

## 2022-05-08 DIAGNOSIS — F339 Major depressive disorder, recurrent, unspecified: Secondary | ICD-10-CM | POA: Insufficient documentation

## 2022-05-08 DIAGNOSIS — I10 Essential (primary) hypertension: Secondary | ICD-10-CM | POA: Diagnosis not present

## 2022-05-08 DIAGNOSIS — Z1159 Encounter for screening for other viral diseases: Secondary | ICD-10-CM

## 2022-05-08 DIAGNOSIS — M542 Cervicalgia: Secondary | ICD-10-CM | POA: Insufficient documentation

## 2022-05-08 MED ORDER — SERTRALINE HCL 50 MG PO TABS: 50.0000 mg | ORAL_TABLET | Freq: Every day | ORAL | 3 refills | Status: DC

## 2022-05-08 MED ORDER — HYDROCHLOROTHIAZIDE 12.5 MG PO TABS: 12.5000 mg | ORAL_TABLET | Freq: Every day | ORAL | 3 refills | Status: DC

## 2022-05-08 NOTE — Patient Instructions (Signed)
-   Obtain labs - Start BP and stress medications - Review information attached - Referral coordinator will contact you to setup cardiology visit - Return in 6 weeks

## 2022-05-09 LAB — TSH: TSH: 1.58 u[IU]/mL (ref 0.450–4.500)

## 2022-05-09 LAB — HEPATITIS C ANTIBODY: Hep C Virus Ab: NONREACTIVE

## 2022-05-09 LAB — COMPREHENSIVE METABOLIC PANEL
ALT: 24 IU/L (ref 0–44)
AST: 18 IU/L (ref 0–40)
Albumin/Globulin Ratio: 2.2 (ref 1.2–2.2)
Albumin: 5 g/dL (ref 4.1–5.1)
Alkaline Phosphatase: 56 IU/L (ref 44–121)
BUN/Creatinine Ratio: 14 (ref 9–20)
BUN: 14 mg/dL (ref 6–20)
Bilirubin Total: 0.4 mg/dL (ref 0.0–1.2)
CO2: 24 mmol/L (ref 20–29)
Calcium: 9.8 mg/dL (ref 8.7–10.2)
Chloride: 102 mmol/L (ref 96–106)
Creatinine, Ser: 0.99 mg/dL (ref 0.76–1.27)
Globulin, Total: 2.3 g/dL (ref 1.5–4.5)
Glucose: 92 mg/dL (ref 70–99)
Potassium: 4.5 mmol/L (ref 3.5–5.2)
Sodium: 140 mmol/L (ref 134–144)
Total Protein: 7.3 g/dL (ref 6.0–8.5)
eGFR: 99 mL/min/{1.73_m2} (ref >59)

## 2022-05-09 LAB — LIPID PANEL
Chol/HDL Ratio: 3.2 ratio (ref 0.0–5.0)
Cholesterol, Total: 179 mg/dL (ref 100–199)
HDL: 56 mg/dL (ref >39)
LDL Chol Calc (NIH): 83 mg/dL (ref 0–99)
Triglycerides: 244 mg/dL — ABNORMAL HIGH (ref 0–149)
VLDL Cholesterol Cal: 40 mg/dL (ref 5–40)

## 2022-05-09 LAB — APO A1 + B + RATIO
Apolipo. B/A-1 Ratio: 0.5 ratio (ref 0.0–0.7)
Apolipoprotein A-1: 167 mg/dL (ref 101–178)
Apolipoprotein B: 85 mg/dL (ref <90)

## 2022-05-09 LAB — VITAMIN D 25 HYDROXY (VIT D DEFICIENCY, FRACTURES): Vit D, 25-Hydroxy: 8 ng/mL — ABNORMAL LOW (ref 30.0–100.0)

## 2022-05-09 LAB — CBC
Hematocrit: 43.7 % (ref 37.5–51.0)
Hemoglobin: 15.4 g/dL (ref 13.0–17.7)
MCH: 32.6 pg (ref 26.6–33.0)
MCHC: 35.2 g/dL (ref 31.5–35.7)
MCV: 93 fL (ref 79–97)
Platelets: 200 10*3/uL (ref 150–450)
RBC: 4.72 x10E6/uL (ref 4.14–5.80)
RDW: 12 % (ref 11.6–15.4)
WBC: 7.3 10*3/uL (ref 3.4–10.8)

## 2022-05-09 LAB — HIV ANTIBODY (ROUTINE TESTING W REFLEX): HIV Screen 4th Generation wRfx: NONREACTIVE

## 2022-05-15 ENCOUNTER — Other Ambulatory Visit: Payer: Self-pay | Admitting: Family Medicine

## 2022-05-15 MED ORDER — VITAMIN D (ERGOCALCIFEROL) 1.25 MG (50000 UNIT) PO CAPS: 50000.0000 [IU] | ORAL_CAPSULE | ORAL | 0 refills | Status: DC

## 2022-05-19 NOTE — Progress Notes (Signed)
     Primary Care / Sports Medicine Office Visit  Patient Information:  Patient ID: Chad Duncan, male DOB: 1980/10/03 Age: 42 y.o. MRN: [id number]   Chad Duncan is a pleasant 42 y.o. male presenting with the following:  Chief Complaint  Patient presents with   Establish Care   Hypertension    Vitals:   05/08/22 1511 05/08/22 1514  BP: (!) 170/110 (!) 178/120  Pulse: 88   SpO2: 97%    Vitals:   05/08/22 1511  Weight: 200 lb 9.6 oz (91 kg)  Height: 6\' 1"  (1.854 m)   Body mass index is 26.47 kg/m.  No results found.   Independent interpretation of notes and tests performed by another provider:   None  Procedures performed:   None  Pertinent History, Exam, Impression, and Recommendations:   Problem List Items Addressed This Visit       Cardiovascular and Mediastinum   Hypertension - Primary    Chronic issue, without cardiopulmonary complaints. Examination without JVD, +S1, S2, RRR, no MRG, CTA bl without WRR.  Plan for risk stratification labs and initiation of HCTZ 12.5 mg.Close follow-up advised. Referral to cardiology placed.      Relevant Medications   hydrochlorothiazide (HYDRODIURIL) 12.5 MG tablet   Other Relevant Orders   Ambulatory referral to Cardiology   Apo A1 + B + Ratio (Completed)   CBC (Completed)   Comprehensive metabolic panel (Completed)   Lipid panel (Completed)   TSH (Completed)   VITAMIN D 25 Hydroxy (Vit-D Deficiency, Fractures) (Completed)     Other   Anxiety    In the setting of comorbid HTN, discussed interplay of conditions. Is amenable to Zoloft 25 mg daily, close follow-up.      Relevant Medications   sertraline (ZOLOFT) 50 MG tablet   Other Visit Diagnoses     Screening for HIV (human immunodeficiency virus)       Relevant Orders   HIV Antibody (routine testing w rflx) (Completed)   Need for hepatitis C screening test       Relevant Orders   Hepatitis C antibody (Completed)   Screening for lipoid disorders        Relevant Orders   Comprehensive metabolic panel (Completed)   Lipid panel (Completed)   Low serum vitamin D       Relevant Orders   VITAMIN D 25 Hydroxy (Vit-D Deficiency, Fractures) (Completed)        Orders & Medications Meds ordered this encounter  Medications   hydrochlorothiazide (HYDRODIURIL) 12.5 MG tablet    Sig: Take 1 tablet (12.5 mg total) by mouth daily.    Dispense:  30 tablet    Refill:  3   sertraline (ZOLOFT) 50 MG tablet    Sig: Take 1 tablet (50 mg total) by mouth daily.    Dispense:  30 tablet    Refill:  3   Orders Placed This Encounter  Procedures   Apo A1 + B + Ratio   CBC   Comprehensive metabolic panel   Lipid panel   TSH   VITAMIN D 25 Hydroxy (Vit-D Deficiency, Fractures)   HIV Antibody (routine testing w rflx)   Hepatitis C antibody   Ambulatory referral to Cardiology     Return in about 6 weeks (around 06/19/2022) for CPE.     Montel Culver, MD, [facility]   Primary Care Sports Medicine Primary Care and Sports Medicine at [facility]

## 2022-05-19 NOTE — Assessment & Plan Note (Addendum)
Chronic issue, without cardiopulmonary complaints. Examination without JVD, +S1, S2, RRR, no MRG, CTA bl without WRR.  Plan for risk stratification labs and initiation of HCTZ 12.5 mg.Close follow-up advised. Referral to cardiology placed.

## 2022-05-19 NOTE — Assessment & Plan Note (Signed)
In the setting of comorbid HTN, discussed interplay of conditions. Is amenable to Zoloft 25 mg daily, close follow-up.

## 2022-06-02 ENCOUNTER — Other Ambulatory Visit: Payer: Self-pay | Admitting: Family Medicine

## 2022-06-02 NOTE — Telephone Encounter (Signed)
Requested Prescriptions  Pending Prescriptions Disp Refills   hydrochlorothiazide (HYDRODIURIL) 12.5 MG tablet [Pharmacy Med Name: HYDROCHLOROTHIAZIDE 12.5 MG TB] 90 tablet 0    Sig: TAKE 1 TABLET BY MOUTH EVERY DAY     Cardiovascular: Diuretics - Thiazide Failed - 06/02/2022  3:32 AM      Failed - Last BP in normal range    BP Readings from Last 1 Encounters:  05/08/22 (!) 178/120         Passed - Cr in normal range and within 180 days    Creatinine  Date Value Ref Range Status  12/21/2012 1.13 0.60 - 1.30 mg/dL Final   Creatinine, Ser  Date Value Ref Range Status  05/08/2022 0.99 0.76 - 1.27 mg/dL Final         Passed - K in normal range and within 180 days    Potassium  Date Value Ref Range Status  05/08/2022 4.5 3.5 - 5.2 mmol/L Final  12/21/2012 3.5 3.5 - 5.1 mmol/L Final         Passed - Na in normal range and within 180 days    Sodium  Date Value Ref Range Status  05/08/2022 140 134 - 144 mmol/L Final  12/21/2012 140 136 - 145 mmol/L Final         Passed - Valid encounter within last 6 months    Recent Outpatient Visits           3 weeks ago Hypertension, unspecified type   [facility] Primary Care and Sports Medicine at [facility], Earley Abide, MD

## 2022-06-04 ENCOUNTER — Other Ambulatory Visit: Payer: Self-pay | Admitting: Family Medicine

## 2022-06-04 NOTE — Telephone Encounter (Signed)
Requested medication (s) are due for refill today: no  Requested medication (s) are on the active medication list: yes  Last refill:  05/15/22 #8  Future visit scheduled: no  Notes to clinic:  med not delegated to NT to  refill or refuse   Requested Prescriptions  Pending Prescriptions Disp Refills   Vitamin D, Ergocalciferol, (DRISDOL) 1.25 MG (50000 UNIT) CAPS capsule [Pharmacy Med Name: VITAMIN D2 1.25MG (50,000 UNIT)] 12 capsule 1    Sig: Take 1 capsule (50,000 Units total) by mouth every 7 (seven) days. Take for 8 total doses(weeks)     Endocrinology:  Vitamins - Vitamin D Supplementation 2 Failed - 06/04/2022  9:32 AM      Failed - Manual Review: Route requests for 50,000 IU strength to the provider      Failed - Vitamin D in normal range and within 360 days    Vit D, 25-Hydroxy  Date Value Ref Range Status  05/08/2022 8.0 (L) 30.0 - 100.0 ng/mL Final    Comment:    Vitamin D deficiency has been defined by the [place] practice guideline as a level of serum 25-OH vitamin D less than 20 ng/mL (1,2). The Endocrine Society went on to further define vitamin D insufficiency as a level between 21 and 29 ng/mL (2). 1. IOM (Institute of Medicine). 2010. Dietary reference    intakes for calcium and D. [place]: The    [employer]. 2. Holick MF, [employer], Bischoff-Ferrari HA, et al.    Evaluation, treatment, and prevention of vitamin D    deficiency: an Endocrine Society clinical practice    guideline. JCEM. 2011 Jul; 96(7):1911-30.          Passed - Ca in normal range and within 360 days    Calcium  Date Value Ref Range Status  05/08/2022 9.8 8.7 - 10.2 mg/dL Final   Calcium, Total  Date Value Ref Range Status  12/21/2012 9.3 8.5 - 10.1 mg/dL Final         Passed - Valid encounter within last 12 months    Recent Outpatient Visits           3 weeks ago Hypertension, unspecified type   [facility] Primary Care  and Sports Medicine at [facility], Earley Abide, MD

## 2022-06-10 DIAGNOSIS — F17210 Nicotine dependence, cigarettes, uncomplicated: Secondary | ICD-10-CM | POA: Diagnosis not present

## 2022-06-10 DIAGNOSIS — I10 Essential (primary) hypertension: Secondary | ICD-10-CM | POA: Diagnosis not present

## 2022-06-10 DIAGNOSIS — F1010 Alcohol abuse, uncomplicated: Secondary | ICD-10-CM | POA: Diagnosis not present

## 2022-06-10 DIAGNOSIS — I517 Cardiomegaly: Secondary | ICD-10-CM | POA: Diagnosis not present

## 2022-06-19 ENCOUNTER — Encounter: Payer: BC Managed Care – PPO | Admitting: Family Medicine

## 2022-06-19 DIAGNOSIS — I10 Essential (primary) hypertension: Secondary | ICD-10-CM | POA: Diagnosis not present

## 2022-06-19 DIAGNOSIS — I517 Cardiomegaly: Secondary | ICD-10-CM | POA: Diagnosis not present

## 2022-07-01 ENCOUNTER — Other Ambulatory Visit: Payer: Self-pay | Admitting: Family Medicine

## 2022-07-02 NOTE — Telephone Encounter (Signed)
Requested medication (s) are due for refill today:Due 07/16/22  Requested medication (s) are on the active medication list: yes    Last refill: 05/15/22  #8  0 refills  Future visit scheduled no  Notes to clinic:Not delegated, please review. Thank you.  Requested Prescriptions  Pending Prescriptions Disp Refills   Vitamin D, Ergocalciferol, (DRISDOL) 1.25 MG (50000 UNIT) CAPS capsule [Pharmacy Med Name: VITAMIN D2 1.25MG(50,000 UNIT)] 12 capsule 1    Sig: Take 1 capsule (50,000 Units total) by mouth every 7 (seven) days. Take for 8 total doses(weeks)     Endocrinology:  Vitamins - Vitamin D Supplementation 2 Failed - 07/01/2022  2:30 PM      Failed - Manual Review: Route requests for 50,000 IU strength to the provider      Failed - Vitamin D in normal range and within 360 days    Vit D, 25-Hydroxy  Date Value Ref Range Status  05/08/2022 8.0 (L) 30.0 - 100.0 ng/mL Final    Comment:    Vitamin D deficiency has been defined by the [place] practice guideline as a level of serum 25-OH vitamin D less than 20 ng/mL (1,2). The Endocrine Society went on to further define vitamin D insufficiency as a level between 21 and 29 ng/mL (2). 1. IOM (Institute of Medicine). 2010. Dietary reference    intakes for calcium and D. [place]: The    [employer]. 2. Holick MF, Binkley [place], Bischoff-Ferrari HA, et al.    Evaluation, treatment, and prevention of vitamin D    deficiency: an Endocrine Society clinical practice    guideline. JCEM. 2011 Jul; 96(7):1911-30.          Passed - Ca in normal range and within 360 days    Calcium  Date Value Ref Range Status  05/08/2022 9.8 8.7 - 10.2 mg/dL Final   Calcium, Total  Date Value Ref Range Status  12/21/2012 9.3 8.5 - 10.1 mg/dL Final         Passed - Valid encounter within last 12 months    Recent Outpatient Visits           1 month ago Hypertension, unspecified type   [facility]  Primary Care & Sports Medicine at [facility], Earley Abide, MD

## 2022-07-09 DIAGNOSIS — R931 Abnormal findings on diagnostic imaging of heart and coronary circulation: Secondary | ICD-10-CM | POA: Diagnosis not present

## 2022-07-09 DIAGNOSIS — T508X5A Adverse effect of diagnostic agents, initial encounter: Secondary | ICD-10-CM | POA: Diagnosis not present

## 2022-07-09 DIAGNOSIS — F17210 Nicotine dependence, cigarettes, uncomplicated: Secondary | ICD-10-CM | POA: Diagnosis not present

## 2022-07-09 DIAGNOSIS — I10 Essential (primary) hypertension: Secondary | ICD-10-CM | POA: Diagnosis not present

## 2022-07-09 DIAGNOSIS — F1090 Alcohol use, unspecified, uncomplicated: Secondary | ICD-10-CM | POA: Diagnosis not present

## 2022-07-09 DIAGNOSIS — Z79899 Other long term (current) drug therapy: Secondary | ICD-10-CM | POA: Diagnosis not present

## 2022-07-09 DIAGNOSIS — I517 Cardiomegaly: Secondary | ICD-10-CM | POA: Diagnosis not present

## 2022-07-10 ENCOUNTER — Telehealth: Payer: Self-pay

## 2022-07-10 NOTE — Transitions of Care (Post Inpatient/ED Visit) (Signed)
   07/10/2022  Name: Chad Duncan MRN: [id number] DOB: Jan 12, 1981  Today's TOC FU Call Status: Today's TOC FU Call Status:: Successful TOC FU Call Competed TOC FU Call Complete Date: 07/10/22  Transition Care Management Follow-up Telephone Call Date of Discharge: 07/09/22 Discharge Facility: Other ([place]) Name of Other ([facility]) Discharge Facility: [facility] Type of Discharge: Emergency Department Reason for ED Visit: Cardiac Conditions Cardiac Conditions Diagnosis: Chest Pain Persisting How have you been since you were released from the hospital?: Same Any questions or concerns?: No  Items Reviewed: Did you receive and understand the discharge instructions provided?: Yes Medications obtained and verified?: Yes (Medications Reviewed) Any new allergies since your discharge?: No Dietary orders reviewed?: NA Do you have support at home?: Yes People in Home: parent(s) Name of Support/Comfort Primary Source: [place] and Equipment/Supplies: [place] Ordered?: No Any new equipment or medical supplies ordered?: No  Functional Questionnaire: Do you need assistance with bathing/showering or dressing?: No Do you need assistance with meal preparation?: No Do you need assistance with eating?: No Do you have difficulty maintaining continence: No Do you need assistance with getting out of bed/getting out of a chair/moving?: No Do you have difficulty managing or taking your medications?: No  Folllow up appointments reviewed: PCP Follow-up appointment confirmed?: Yes Date of PCP follow-up appointment?: 07/17/22 Follow-up Provider: [facility] Follow-up appointment confirmed?: NA Do you need transportation to your follow-up appointment?: No Do you understand care options if your condition(s) worsen?: Yes-patient verbalized understanding    SIGNATURE: Marguarite Arbour CMA

## 2022-07-17 ENCOUNTER — Ambulatory Visit (INDEPENDENT_AMBULATORY_CARE_PROVIDER_SITE_OTHER): Payer: BC Managed Care – PPO | Admitting: Family Medicine

## 2022-07-17 ENCOUNTER — Encounter: Payer: Self-pay | Admitting: Family Medicine

## 2022-07-17 VITALS — BP 152/102 | HR 83 | Ht 73.0 in | Wt 200.0 lb

## 2022-07-17 DIAGNOSIS — F419 Anxiety disorder, unspecified: Secondary | ICD-10-CM | POA: Diagnosis not present

## 2022-07-17 DIAGNOSIS — I10 Essential (primary) hypertension: Secondary | ICD-10-CM

## 2022-07-17 MED ORDER — DIAZEPAM 2 MG PO TABS: 2.0000 mg | ORAL_TABLET | Freq: Three times a day (TID) | ORAL | 0 refills | Status: DC | PRN

## 2022-07-17 NOTE — Patient Instructions (Signed)
-   Continue all blood pressure medications - Maintain check in and follow-up with cardiology - Can dose diazepam 2 mg sparingly - Referral coordinator will contact you to schedule follow-up with psychiatry - Return for follow-up in 3 months

## 2022-07-17 NOTE — Assessment & Plan Note (Signed)
Significantly elevated GAD scores in the setting of somewhat recalcitrant hypertension. Has not been able to tolerate SSRIs citing worsening anxiety, has been on Seroquel in the past raising concern for alternate psychiatric etiologies. Will place urgent referral to psychiatry, patient amenable, will utilize very low dose diazepam 2 mg sparingly over interim. Side effect profile reviewed.

## 2022-07-17 NOTE — Progress Notes (Signed)
     Primary Care / Sports Medicine Office Visit  Patient Information:  Patient ID: MONTELL WINGET, male DOB: 08-12-1980 Age: 42 y.o. MRN: [id number]   EWARD HELOU is a pleasant 42 y.o. male presenting with the following:  Chief Complaint  Patient presents with   Follow-up    ER follow up, 2/21, abnormal echo when at ED, pt feels better, told pt he may have "aortic dissection"     Vitals:   07/17/22 1517 07/17/22 1526  BP: (!) 162/102 (!) 152/102  Pulse: 83   SpO2: 96%    Vitals:   07/17/22 1517  Weight: 200 lb (90.7 kg)  Height: 6' 1"$  (1.854 m)   Body mass index is 26.39 kg/m.  No results found.   Independent interpretation of notes and tests performed by another provider:   None  Procedures performed:   None  Pertinent History, Exam, Impression, and Recommendations:   Kunio was seen today for follow-up.  Anxiety Assessment & Plan: Significantly elevated GAD scores in the setting of somewhat recalcitrant hypertension. Has not been able to tolerate SSRIs citing worsening anxiety, has been on Seroquel in the past raising concern for alternate psychiatric etiologies. Will place urgent referral to psychiatry, patient amenable, will utilize very low dose diazepam 2 mg sparingly over interim. Side effect profile reviewed.  Orders: -     diazePAM; Take 1 tablet (2 mg total) by mouth every 8 (eight) hours as needed for anxiety.  Dispense: 30 tablet; Refill: 0 -     Ambulatory referral to Psychiatry  Hypertension, unspecified type Overview: Recent follow-up with cardiology where, during echocardiogram, concern for arctic dissection raised, emergent transport to ER, further imaging fortunately reassuring, medication has been adjusted, currently dosing lisinopril 40 mg, hydrochlorothiazide 12.5 mg, advised to gradually titrate amlodipine and touch base with cardiologist between now and follow-up with their office during the 09/2022 timeframe.      Orders & Medications Meds  ordered this encounter  Medications   diazepam (VALIUM) 2 MG tablet    Sig: Take 1 tablet (2 mg total) by mouth every 8 (eight) hours as needed for anxiety.    Dispense:  30 tablet    Refill:  0   Orders Placed This Encounter  Procedures   Ambulatory referral to Psychiatry     Return in about 3 months (around 10/15/2022).     Montel Culver, MD, [facility]   Primary Care Sports Medicine Primary Care and Sports Medicine at [facility]

## 2022-07-20 ENCOUNTER — Encounter: Payer: Self-pay | Admitting: Family Medicine

## 2022-07-21 NOTE — Telephone Encounter (Signed)
Please review. Refill was printed and now sent electronically.  KP

## 2022-07-22 ENCOUNTER — Other Ambulatory Visit: Payer: Self-pay | Admitting: Family Medicine

## 2022-07-22 DIAGNOSIS — F419 Anxiety disorder, unspecified: Secondary | ICD-10-CM

## 2022-07-22 MED ORDER — DIAZEPAM 2 MG PO TABS: 2.0000 mg | ORAL_TABLET | Freq: Three times a day (TID) | ORAL | 0 refills | Status: AC | PRN

## 2022-07-22 NOTE — Telephone Encounter (Signed)
advise

## 2022-09-17 DIAGNOSIS — F411 Generalized anxiety disorder: Secondary | ICD-10-CM | POA: Diagnosis not present

## 2022-09-17 DIAGNOSIS — F1010 Alcohol abuse, uncomplicated: Secondary | ICD-10-CM | POA: Diagnosis not present

## 2022-10-14 DIAGNOSIS — I10 Essential (primary) hypertension: Secondary | ICD-10-CM | POA: Diagnosis not present

## 2022-10-14 DIAGNOSIS — F1010 Alcohol abuse, uncomplicated: Secondary | ICD-10-CM | POA: Diagnosis not present

## 2022-10-14 DIAGNOSIS — Z720 Tobacco use: Secondary | ICD-10-CM | POA: Diagnosis not present

## 2022-10-21 ENCOUNTER — Encounter: Payer: Self-pay | Admitting: Family Medicine

## 2022-10-21 ENCOUNTER — Ambulatory Visit (INDEPENDENT_AMBULATORY_CARE_PROVIDER_SITE_OTHER): Payer: BC Managed Care – PPO | Admitting: Family Medicine

## 2022-10-21 VITALS — BP 144/94 | HR 78 | Ht 72.0 in | Wt 199.0 lb

## 2022-10-21 DIAGNOSIS — I1A0 Resistant hypertension: Secondary | ICD-10-CM | POA: Diagnosis not present

## 2022-10-21 DIAGNOSIS — F339 Major depressive disorder, recurrent, unspecified: Secondary | ICD-10-CM

## 2022-10-21 MED ORDER — CARIPRAZINE HCL 1.5 MG PO CAPS: 1.5000 mg | ORAL_CAPSULE | Freq: Every day | ORAL | 0 refills | Status: DC

## 2022-10-21 NOTE — Assessment & Plan Note (Signed)
Persistently elevated, denies cardiopulmonary complaints, will follow peripherally.

## 2022-10-21 NOTE — Assessment & Plan Note (Addendum)
Despite interval improved PHQ and GAD scores, persistent symptomatology relayed.  Patient did have an opportunity to establish with psychiatry but felt that it was not a good fit.  Is looking for a behavioral therapist.  Over the interim we discussed treatment options, plan as follows: - Start Vraylar, prior positive response from Seroquel - Follow-up in 6 weeks - Interim status updates

## 2022-10-21 NOTE — Progress Notes (Signed)
     Primary Care / Sports Medicine Office Visit  Patient Information:  Patient ID: Chad Duncan, male DOB: [birth year] Age: 42 y.o. MRN: [id number]   Chad Duncan is a pleasant 42 y.o. male presenting with the following:  Chief Complaint  Patient presents with   Hypertension    Vitals:   10/21/22 1448 10/21/22 1451  BP: (!) 142/92 (!) 144/94  Pulse: 78   SpO2: 99%    Vitals:   10/21/22 1448  Weight: 199 lb (90.3 kg)  Height: 6' (1.829 m)   Body mass index is 26.99 kg/m.  No results found.   Independent interpretation of notes and tests performed by another provider:   None  Procedures performed:   None  Pertinent History, Exam, Impression, and Recommendations:   Chad Duncan was seen today for hypertension.  Chronic major depressive disorder, recurrent episode (HCC) Overview: Previous treatments: - SSRIs (significant agitation) - Seroquel (modest response) - Vraylar (current treatment)  Assessment & Plan: Despite interval improved PHQ and GAD scores, persistent symptomatology relayed.  Patient did have an opportunity to establish with psychiatry but felt that it was not a good fit.  Is looking for a behavioral therapist.  Over the interim we discussed treatment options, plan as follows: - Start Vraylar, prior positive response from Seroquel - Follow-up in 6 weeks - Interim status updates   Resistant hypertension Overview: Recent follow-up with cardiology where, during echocardiogram, concern for arctic dissection raised, emergent transport to ER, further imaging fortunately reassuring, medication has been adjusted, currently dosing lisinopril 40 mg, hydrochlorothiazide 12.5 mg, advised to gradually titrate amlodipine and touch base with cardiologist between now and follow-up with their office during the 09/2022 timeframe.  Assessment & Plan: Persistently elevated, denies cardiopulmonary complaints, will follow peripherally.   Other orders -     Cariprazine HCl;  Take 1 capsule (1.5 mg total) by mouth daily.  Dispense: 90 capsule; Refill: 0     Orders & Medications Meds ordered this encounter  Medications   cariprazine (VRAYLAR) 1.5 MG capsule    Sig: Take 1 capsule (1.5 mg total) by mouth daily.    Dispense:  90 capsule    Refill:  0   No orders of the defined types were placed in this encounter.    No follow-ups on file.     Jerrol Banana, MD, [facility]   Primary Care Sports Medicine Primary Care and Sports Medicine at [facility]

## 2022-10-21 NOTE — Patient Instructions (Signed)
-   Start Vraylar 1.5 mg daily - Continue blood pressure medications - Work on cutting back tobacco and alcohol - Return for follow-up in 6 weeks

## 2022-11-25 ENCOUNTER — Telehealth (INDEPENDENT_AMBULATORY_CARE_PROVIDER_SITE_OTHER): Payer: BC Managed Care – PPO | Admitting: Family Medicine

## 2022-11-25 ENCOUNTER — Encounter: Payer: Self-pay | Admitting: Family Medicine

## 2022-11-25 VITALS — Temp 100.1°F | Ht 73.0 in | Wt 199.0 lb

## 2022-11-25 DIAGNOSIS — U071 COVID-19: Secondary | ICD-10-CM

## 2022-11-25 MED ORDER — NIRMATRELVIR/RITONAVIR (PAXLOVID)TABLET: 3.0000 | ORAL_TABLET | Freq: Two times a day (BID) | ORAL | 0 refills | Status: AC

## 2022-11-25 NOTE — Assessment & Plan Note (Signed)
2nd day of symptoms, took COVID test today x 2 with positive results. Has had COVID before which took roughly 1 week to improve without antiviral, did have vaccination series.  We reviewed risks to benefits, plan as follows: - Paxlovid Rx - Supportive care - Out of work until next week

## 2022-11-25 NOTE — Progress Notes (Signed)
Primary Care / Sports Medicine Virtual Visit  Patient Information:  Patient ID: EPHREM KNISELEY, male DOB: 1981/01/07 Age: 42 y.o. MRN: [id number]   EFREM RYKEN is a pleasant 42 y.o. male presenting with the following:  Chief Complaint  Patient presents with   Covid Positive    X 2 days, shaking, face puffed up, fever of 101.4, body aching everywhere, chills, headaches    Review of Systems: No fevers, chills, night sweats, weight loss, chest pain, or shortness of breath.   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   diltiazem (CARDIZEM CD) 180 MG 24 hr capsule Take 180 mg by mouth daily.   hydrochlorothiazide (HYDRODIURIL) 25 MG tablet Take 1 tablet by mouth daily.   lisinopril (ZESTRIL) 40 MG tablet Take 1 tablet by mouth daily.   nirmatrelvir/ritonavir (PAXLOVID) 20 x 150 MG & 10 x 100MG  TABS Take 3 tablets by mouth 2 (two) times daily for 5 days. (Take nirmatrelvir 150 mg two tablets twice daily for 5 days and ritonavir 100 mg one tablet twice daily for 5 days) Patient GFR is 99   [DISCONTINUED] cariprazine (VRAYLAR) 1.5 MG capsule Take 1 capsule (1.5 mg total) by mouth daily. (Patient not taking: Reported on 11/25/2022)   No facility-administered encounter medications on file as of 11/25/2022.   Past Surgical History:  Procedure Laterality Date   CERVICAL FUSION     C6-C7   SPINE SURGERY  2018   C6-c7  fusion    Virtual Visit via MyChart Video:   I connected with Aura Camps on 11/25/22 via MyChart Video and verified that I am speaking with the correct person using appropriate identifiers.   The limitations, risks, security and privacy concerns of performing an evaluation and management service by MyChart Video, including the higher likelihood of inaccurate diagnoses and treatments, and the availability of in person appointments were reviewed. The possible need of an additional face-to-face encounter for complete and high quality delivery of care was discussed. The patient was also made aware that there may be a patient responsible charge related to this service. The patient expressed understanding and wishes to proceed.  Provider location is in medical facility. Patient location is at their home, different from provider location. People involved in care of the patient during this telehealth encounter were myself, my nurse/medical assistant, and my front office/scheduling team member.  Objective findings:   General: Speaking full sentences, no audible heavy breathing. Sounds alert and appropriately interactive. Ill-appearing. Face symmetric. Extraocular movements intact. Pupils equal and round. No nasal flaring or accessory muscle use visualized.  Independent interpretation of notes and tests performed by another provider:   None  Pertinent History, Exam, Impression, and Recommendations:   COVID 2nd day of symptoms, took COVID test today x 2 with positive results. Has had COVID before which took roughly 1 week to improve without antiviral, did have  vaccination series.  We reviewed risks to benefits, plan as follows: - Paxlovid Rx - Supportive care - Out of work until next week  Orders & Medications Meds ordered this encounter  Medications   nirmatrelvir/ritonavir (PAXLOVID) 20 x 150 MG & 10 x 100MG  TABS    Sig: Take 3 tablets by mouth 2 (two) times daily for 5 days. (Take  nirmatrelvir 150 mg two tablets twice daily for 5 days and ritonavir 100 mg one tablet twice daily for 5 days) Patient GFR is 99    Dispense:  30 tablet    Refill:  0   No orders of the defined types were placed in this encounter.    I discussed the above assessment and treatment plan with the patient. The patient was provided an opportunity to ask questions and all were answered. The patient agreed with the plan and demonstrated an understanding of the instructions.   The patient was advised to call back or seek an in-person evaluation if the symptoms worsen or if the condition fails to improve as anticipated.   I provided a total time of 30 minutes including both face-to-face and non-face-to-face time on 11/25/2022 inclusive of time utilized for medical chart review, information gathering, care coordination with staff, and documentation completion.    Jerrol Banana, MD, [facility]   Primary Care Sports Medicine Primary Care and Sports Medicine at [facility]

## 2022-11-25 NOTE — Patient Instructions (Signed)
-   Take paxlovid for full course - Continue medications for fever - Stay hydrated, get rest, consider vitamin C, D, and zinc - Remain out of work until next Monday  - Stay home and away from others until, for 24 hours, both: - your symptoms are getting better - you are fever-free (without the need for medications) - Then take added precautions for an additional 5 days (masking, distance, etc)

## 2022-12-02 ENCOUNTER — Ambulatory Visit: Payer: BC Managed Care – PPO | Admitting: Family Medicine

## 2022-12-08 ENCOUNTER — Encounter: Payer: Self-pay | Admitting: Family Medicine

## 2022-12-08 ENCOUNTER — Ambulatory Visit (INDEPENDENT_AMBULATORY_CARE_PROVIDER_SITE_OTHER): Payer: BC Managed Care – PPO | Admitting: Family Medicine

## 2022-12-08 VITALS — BP 138/88 | HR 72 | Ht 72.0 in | Wt 191.0 lb

## 2022-12-08 DIAGNOSIS — U071 COVID-19: Secondary | ICD-10-CM

## 2022-12-08 DIAGNOSIS — F339 Major depressive disorder, recurrent, unspecified: Secondary | ICD-10-CM

## 2022-12-08 DIAGNOSIS — I1A0 Resistant hypertension: Secondary | ICD-10-CM

## 2022-12-08 MED ORDER — HYDROCHLOROTHIAZIDE 25 MG PO TABS
25.0000 mg | ORAL_TABLET | Freq: Every day | ORAL | 1 refills | Status: DC
Start: 2022-12-08 — End: 2023-07-06

## 2022-12-08 MED ORDER — LISINOPRIL 40 MG PO TABS
40.0000 mg | ORAL_TABLET | Freq: Every day | ORAL | 1 refills | Status: DC
Start: 2022-12-08 — End: 2023-12-25

## 2022-12-08 MED ORDER — DILTIAZEM HCL ER COATED BEADS 180 MG PO CP24: 180.0000 mg | ORAL_CAPSULE | Freq: Every day | ORAL | 1 refills | Status: DC

## 2022-12-08 NOTE — Assessment & Plan Note (Signed)
Reports doing well citing improvements regarding work. Has plans to cut back on alcohol and then work on cutting down tobacco.   - Focus on non-pharmacologic management  - Can contact for interim treatment options - Return in 6 months

## 2022-12-08 NOTE — Patient Instructions (Signed)
For facial pressure: - Start antihistamine (Claritin, Zyrtec, etc) - Use Flonase for additional symptom control  - Focus on non-medication stress management  - Can contact for questions / treatments between now and then - Return in 6 months

## 2022-12-08 NOTE — Assessment & Plan Note (Signed)
Doing well on current regimen .  

## 2022-12-08 NOTE — Assessment & Plan Note (Signed)
Some residual right facial discomfort, TMs with fluid posterior, mildly swollen turbinates.  - Supportive care with Flonase, antihistamines, monitoring

## 2022-12-08 NOTE — Progress Notes (Signed)
     Primary Care / Sports Medicine Office Visit  Patient Information:  Patient ID: Chad Duncan, male DOB: 1981-02-10 Age: 42 y.o. MRN: [id number]   Chad Duncan is a pleasant 42 y.o. male presenting with the following:  Chief Complaint  Patient presents with   Chronic major depressive disorder, recurrent episode    Vitals:   12/08/22 0811  BP: 138/88  Pulse: 72  SpO2: 98%   Vitals:   12/08/22 0811  Weight: 191 lb (86.6 kg)  Height: 6' (1.829 m)   Body mass index is 25.9 kg/m.  No results found.   Independent interpretation of notes and tests performed by another provider:   None  Procedures performed:   None  Pertinent History, Exam, Impression, and Recommendations:   Chad Duncan was seen today for chronic major depressive disorder, recurrent episode.  Resistant hypertension Overview: Recent follow-up with cardiology where, during echocardiogram, concern for arctic dissection raised, emergent transport to ER, further imaging fortunately reassuring, medication has been adjusted, currently dosing lisinopril 40 mg, hydrochlorothiazide 12.5 mg, advised to gradually titrate amlodipine and touch base with cardiologist between now and follow-up with their office during the 09/2022 timeframe.  Assessment & Plan: Doing well on current regimen  Orders: -     dilTIAZem HCl ER Coated Beads; Take 1 capsule (180 mg total) by mouth daily.  Dispense: 90 capsule; Refill: 1 -     hydroCHLOROthiazide; Take 1 tablet (25 mg total) by mouth daily.  Dispense: 90 tablet; Refill: 1 -     Lisinopril; Take 1 tablet (40 mg total) by mouth daily.  Dispense: 90 tablet; Refill: 1  Chronic major depressive disorder, recurrent episode (HCC) Overview: Previous treatments: - SSRIs (significant agitation) - Seroquel (modest response) - Vraylar (self-discontinued / never started)  Assessment & Plan: Reports doing well citing improvements regarding work. Has plans to cut back on alcohol and then work  on cutting down tobacco.   - Focus on non-pharmacologic management  - Can contact for interim treatment options - Return in 6 months   COVID Assessment & Plan: Some residual right facial discomfort, TMs with fluid posterior, mildly swollen turbinates.  - Supportive care with Flonase, antihistamines, monitoring      Orders & Medications Meds ordered this encounter  Medications   diltiazem (CARDIZEM CD) 180 MG 24 hr capsule    Sig: Take 1 capsule (180 mg total) by mouth daily.    Dispense:  90 capsule    Refill:  1   hydrochlorothiazide (HYDRODIURIL) 25 MG tablet    Sig: Take 1 tablet (25 mg total) by mouth daily.    Dispense:  90 tablet    Refill:  1   lisinopril (ZESTRIL) 40 MG tablet    Sig: Take 1 tablet (40 mg total) by mouth daily.    Dispense:  90 tablet    Refill:  1   No orders of the defined types were placed in this encounter.    No follow-ups on file.     Jerrol Banana, MD, [facility]   Primary Care Sports Medicine Primary Care and Sports Medicine at [facility]

## 2023-03-26 ENCOUNTER — Encounter: Payer: Self-pay | Admitting: Family Medicine

## 2023-03-26 NOTE — Telephone Encounter (Signed)
Please review.  KP

## 2023-05-28 ENCOUNTER — Ambulatory Visit (INDEPENDENT_AMBULATORY_CARE_PROVIDER_SITE_OTHER): Payer: BC Managed Care – PPO | Admitting: Family Medicine

## 2023-05-28 ENCOUNTER — Encounter: Payer: Self-pay | Admitting: Family Medicine

## 2023-05-28 VITALS — BP 140/82 | HR 71 | Ht 72.0 in | Wt 192.2 lb

## 2023-05-28 DIAGNOSIS — I1A0 Resistant hypertension: Secondary | ICD-10-CM

## 2023-05-28 DIAGNOSIS — M47812 Spondylosis without myelopathy or radiculopathy, cervical region: Secondary | ICD-10-CM | POA: Diagnosis not present

## 2023-05-28 NOTE — Assessment & Plan Note (Signed)
 The patient also mentions a history of neck problems, but reports no current issues with this.  Cervical Spine Pain Patient reports discomfort and stiffness in the neck. No specific injury or acute event noted. Discussed the importance of neck-specific exercises and maintaining good posture. -Recommend neck exercises for strengthening and improving range of motion. -Consider active recovery on off days from regular exercise routine.

## 2023-05-28 NOTE — Progress Notes (Signed)
     Primary Care / Sports Medicine Office Visit  Patient Information:  Patient ID: Chad Duncan, male DOB: 29-Sep-1980 Age: 43 y.o. MRN: [id number]   Chad Duncan is a pleasant 43 y.o. male presenting with the following:  Chief Complaint  Patient presents with   Medical Management of Chronic Issues    Patient presents  today to follow up on his HTN. He has been taking his medication as directed.    Vitals:   05/28/23 0810  BP: (!) 140/82  Pulse: 71  SpO2: 98%   Vitals:   05/28/23 0810  Weight: 192 lb 3.2 oz (87.2 kg)  Height: 6' (1.829 m)   Body mass index is 26.07 kg/m.  No results found.   Independent interpretation of notes and tests performed by another provider:   None  Procedures performed:   None  Pertinent History, Exam, Impression, and Recommendations:   Problem List Items Addressed This Visit     Cervical spondylosis without myelopathy   The patient also mentions a history of neck problems, but reports no current issues with this.  Cervical Spine Pain Patient reports discomfort and stiffness in the neck. No specific injury or acute event noted. Discussed the importance of neck-specific exercises and maintaining good posture. -Recommend neck exercises for strengthening and improving range of motion. -Consider active recovery on off days from regular exercise routine.      Hypertension - Primary   The patient reports that his blood pressure has been slightly elevated, with the most recent reading being 140/82. The patient is currently on Lisinopril , Hydrochlorothiazide , and Diltiazem  for blood pressure management.  VITALS: BP- 140/82  Hypertension Blood pressure reading slightly above goal. Patient is currently on Lisinopril , Hydrochlorothiazide , and Diltiazem . Discussed the potential impact of lifestyle factors such as caffeine, nicotine, and alcohol on blood pressure. -Continue current antihypertensive regimen. -Consider adding Bupropion for smoking  cessation and potential mood benefits, which are most likely contributing factors to his hypertension in addition to alcohol intake, if patient agrees.      I provided a total time of 30 minutes including both face-to-face and non-face-to-face time on 05/28/2023 inclusive of time utilized for medical chart review, information gathering, care coordination with staff, and documentation completion.   Orders & Medications Medications: No orders of the defined types were placed in this encounter.  No orders of the defined types were placed in this encounter.    Return in about 1 year (around 05/09/2024) for CPE.     Selinda JINNY Ku, MD, [facility]   Primary Care Sports Medicine Primary Care and Sports Medicine at [facility]

## 2023-05-28 NOTE — Patient Instructions (Signed)
 Plan Details:  - Hypertension:   - Current issue: Blood pressure readings slightly above the goal.   - Current medications: Lisinopril , Hydrochlorothiazide , and Diltiazem .   - Lifestyle factors: Caffeine, nicotine, and alcohol can affect blood pressure.   - Action:     - Continue current medications.     - Discussed potential addition of Bupropion to aid smoking cessation and mood improvement.  - Cervical Spine Pain:   - Issue: Discomfort or stiffness in the neck area.   - Action:     - Recommend neck exercises to strengthen the neck and improve range of motion. (See below)     - Maintain good posture.     - Consider active recovery on off days from regular exercise.  - Eustachian Tube Dysfunction:   - Issue: Sensation of clogged ears due to dysfunction.   - Action:     - Use saline nasal spray to clear nasal passages.     - If symptoms persist or worsen, try over-the-counter Claritin.     - Contact office if symptoms worsen or if ear pain develops.

## 2023-05-28 NOTE — Assessment & Plan Note (Signed)
 The patient reports that his blood pressure has been slightly elevated, with the most recent reading being 140/82. The patient is currently on Lisinopril , Hydrochlorothiazide , and Diltiazem  for blood pressure management.  VITALS: BP- 140/82  Hypertension Blood pressure reading slightly above goal. Patient is currently on Lisinopril , Hydrochlorothiazide , and Diltiazem . Discussed the potential impact of lifestyle factors such as caffeine, nicotine, and alcohol on blood pressure. -Continue current antihypertensive regimen. -Consider adding Bupropion for smoking cessation and potential mood benefits, which are most likely contributing factors to his hypertension in addition to alcohol intake, if patient agrees.

## 2023-07-04 ENCOUNTER — Other Ambulatory Visit: Payer: Self-pay | Admitting: Family Medicine

## 2023-07-04 DIAGNOSIS — I1A0 Resistant hypertension: Secondary | ICD-10-CM

## 2023-07-06 NOTE — Telephone Encounter (Signed)
Requested medications are due for refill today.  yes  Requested medications are on the active medications list.  yes  Last refill. 12/03/2022 #90 1 rf  Future visit scheduled.   yes  Notes to clinic.  Labs are expired.    Requested Prescriptions  Pending Prescriptions Disp Refills   hydrochlorothiazide (HYDRODIURIL) 25 MG tablet [Pharmacy Med Name: HYDROCHLOROTHIAZIDE 25 MG TAB] 90 tablet 1    Sig: Take 1 tablet (25 mg total) by mouth daily.     Cardiovascular: Diuretics - Thiazide Failed - 07/06/2023  1:04 PM      Failed - Cr in normal range and within 180 days    Creatinine  Date Value Ref Range Status  12/21/2012 1.13 0.60 - 1.30 mg/dL Final   Creatinine, Ser  Date Value Ref Range Status  05/08/2022 0.99 0.76 - 1.27 mg/dL Final         Failed - K in normal range and within 180 days    Potassium  Date Value Ref Range Status  05/08/2022 4.5 3.5 - 5.2 mmol/L Final  12/21/2012 3.5 3.5 - 5.1 mmol/L Final         Failed - Na in normal range and within 180 days    Sodium  Date Value Ref Range Status  05/08/2022 140 134 - 144 mmol/L Final  12/21/2012 140 136 - 145 mmol/L Final         Failed - Last BP in normal range    BP Readings from Last 1 Encounters:  05/28/23 (!) 140/82         Passed - Valid encounter within last 6 months    Recent Outpatient Visits           1 month ago Resistant hypertension   [place] [facility], Ocie Bob, MD   7 months ago Resistant hypertension   [place] [facility] at [facility], Ocie Bob, MD   7 months ago COVID   [facility] Primary Care & Sports Medicine at [facility] Ashley Royalty, Ocie Bob, MD   8 months ago Chronic major depressive disorder, recurrent episode [facility])   [place] [facility] Care & Sports Medicine at [facility], Ocie Bob, MD   11 months ago Anxiety   [facility] Primary Care & Sports Medicine at [facility], Ocie Bob, MD       Future Appointments             In 10 months Ashley Royalty, Ocie Bob, MD [facility] Primary Care & Sports Medicine at [facility], [facility]

## 2023-09-14 ENCOUNTER — Ambulatory Visit: Payer: Self-pay

## 2023-09-14 ENCOUNTER — Encounter: Payer: Self-pay | Admitting: Family Medicine

## 2023-09-14 NOTE — Telephone Encounter (Signed)
 Chief Complaint: Back pain Symptoms: Back pain Frequency: since yesterday  Pertinent Negatives: Patient denies blood in urine, pain in abdomen Disposition: [] ED /[] Urgent Care (no appt availability in office) / [x] Appointment(In office/virtual)/ []  [place] [facility]/ [] Home Care/ [] Refused Recommended Disposition /[] [facility] [facility]/ []  Follow-up with PCP Additional Notes: Patient called in stating he would like to be evaluated for his back pain. Patient states his pain is pretty high right now and nearly takes him to his knees when he tries to get up and walk around. Patient went on an intense hike yesterday and noticed some tightness in his back that worsened overnight. Patient states it feels similar to the pain he felt when he needed a C6-C7 fusion about 7 years ago. Patient encouraged to be seen asap and states he will be seen first thing in the morning by PCP.    Copied from CRM [id number]. Topic: Clinical - Red Word Triage >> Sep 14, 2023  5:08 PM Ethelle Herb L wrote: Red Word that prompted transfer to Nurse Triage: Pain in middle of lower back into leg, unable to walk Reason for Disposition  [1] SEVERE back pain (e.g., excruciating, unable to do any normal activities) AND [2] not improved 2 hours after pain medicine  Answer Assessment - Initial Assessment Questions 1. ONSET: "When did the pain begin?"      Yesterday 2. LOCATION: "Where does it hurt?" (upper, mid or lower back)     Lower middle back over right side of tail bone 3. SEVERITY: "How bad is the pain?"  (e.g., Scale 1-10; mild, moderate, or severe)   - MILD (1-3): Doesn't interfere with normal activities.    - MODERATE (4-7): Interferes with normal activities or awakens from sleep.    - SEVERE (8-10): Excruciating pain, unable to do any normal activities.      Patient states when he tries to stand up it takes him to his knees 4. PATTERN: "Is the pain constant?" (e.g., yes, no; constant, intermittent)      Constant 5.  RADIATION: "Does the pain shoot into your legs or somewhere else?"     Right leg 6. CAUSE:  "What do you think is causing the back pain?"      Patient states it feels like when he had C6-C7 fusion years ago 7. BACK OVERUSE:  "Any recent lifting of heavy objects, strenuous work or exercise?"     Went on intense hike yesterday 8. MEDICINES: "What have you taken so far for the pain?" (e.g., nothing, acetaminophen , NSAIDS)     Patient has pain medication (Hydrocone-Acetaminophen  5-325 mg) 9. NEUROLOGIC SYMPTOMS: "Do you have any weakness, numbness, or problems with bowel/bladder control?"     No  10. OTHER SYMPTOMS: "Do you have any other symptoms?" (e.g., fever, abdomen pain, burning with urination, blood in urine)       No  Protocols used: Back Pain-A-AH

## 2023-09-15 ENCOUNTER — Encounter: Payer: Self-pay | Admitting: Family Medicine

## 2023-09-15 ENCOUNTER — Ambulatory Visit
Admission: RE | Admit: 2023-09-15 | Discharge: 2023-09-15 | Disposition: A | Discharge status: Home / Self Care | Source: Ambulatory Visit | Attending: Family Medicine

## 2023-09-15 ENCOUNTER — Ambulatory Visit
Admission: RE | Admit: 2023-09-15 | Discharge: 2023-09-15 | Disposition: A | Discharge status: Home / Self Care | Attending: Family Medicine | Admitting: Family Medicine

## 2023-09-15 ENCOUNTER — Ambulatory Visit (INDEPENDENT_AMBULATORY_CARE_PROVIDER_SITE_OTHER): Admitting: Family Medicine

## 2023-09-15 VITALS — BP 134/82 | HR 70 | Ht 72.0 in | Wt 192.0 lb

## 2023-09-15 DIAGNOSIS — M5416 Radiculopathy, lumbar region: Secondary | ICD-10-CM | POA: Insufficient documentation

## 2023-09-15 MED ORDER — CYCLOBENZAPRINE HCL 10 MG PO TABS: 10.0000 mg | ORAL_TABLET | Freq: Three times a day (TID) | ORAL | 1 refills | Status: AC | PRN

## 2023-09-15 MED ORDER — OXYCODONE-ACETAMINOPHEN 7.5-325 MG PO TABS: 1.0000 | ORAL_TABLET | Freq: Three times a day (TID) | ORAL | 0 refills | Status: AC | PRN

## 2023-09-15 MED ORDER — DICLOFENAC SODIUM 75 MG PO TBEC: 75.0000 mg | DELAYED_RELEASE_TABLET | Freq: Two times a day (BID) | ORAL | 0 refills | Status: DC

## 2023-09-15 MED ORDER — PREDNISONE 50 MG PO TABS: 50.0000 mg | ORAL_TABLET | Freq: Two times a day (BID) | ORAL | 0 refills | Status: AC

## 2023-09-15 NOTE — Telephone Encounter (Signed)
 Noted  Pt has a appt.  KP

## 2023-09-15 NOTE — Assessment & Plan Note (Addendum)
 History of Present Illness Chad Duncan "Chad Duncan" is a 43 year old male who presents with acute low back pain radiating to the right thigh after a recent hike.  He experienced the onset of low back pain two days ago following a hike. The pain is intense, radiating from the low back into the right thigh, both anteriorly and posteriorly, but does not extend past the knee. He describes the pain as 'like an ice pick' in the lower lumbar region.  He has been using hydrocodone  medication from a previous neck surgery, which provides some relief, but the pain remains significant. He has not taken any anti-inflammatory medications like ibuprofen or Tylenol  due to lack of availability at home and difficulty driving.  The pain is constant regardless of position, but transitions in movement exacerbate it. He has difficulty sleeping, with the most comfortable position being on his back with a heating pad.  He experiences weakness in the right leg when moving in certain ways, and pain limits his ability to exert full strength during hip flexion. No numbness in the saddle area, no bowel or bladder insensate problems endorsed.  Physical Exam PALPATION: Positive Kemps test, tenderness localized to lower lumbar paraspinal region. STRENGTH: Resisted hip flexion 5/5 strength left, 4/5 strength right limited by pain. Knee flexion 5/5 strength bilaterally. Right knee extension possibly 5-/5 strength. SPECIAL TESTS: Negative straight leg raise on the right and left. NEUROVASCULAR: Sensorimotor intact.   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 Increased urinary frequency noted. Differential includes nerve involvement from lumbar spine arthritis. - Order lumbar spine x-ray to assess for arthritis. - Prescribe prednisone  50 mg twice daily for 5 days. - Prescribe Flexeril  up to three times  daily as needed. - Advise heat for muscle relaxation, ice for inflammation. - Instruct on gentle motion exercises after rest, avoid strenuous activity. - Advise alternating sitting and standing every 30-45 minutes. - Prescribe diclofenac  twice daily with food post-prednisone . - Provide work note for two weeks off. - Refer to [facility] if symptoms persist. - Advise direct contact with neurosurgery group for appointment.

## 2023-09-17 NOTE — Progress Notes (Signed)
 Primary Care / Sports Medicine Office Visit  Patient Information:  Patient ID: AMAHRI BONGO, male DOB: 1980-06-26 Age: 43 y.o. MRN: [id number]   Chad Duncan is a pleasant 43 y.o. male presenting with the following:  Chief Complaint  Patient presents with   Back Pain    Patient went on a hike on 09/13/23. He noticed after the hike he had low back. Since the pain has got very intense and has some right thigh pain that radiates from low back into posterior and anterior aspects of thigh. He had two hydrocodone  from a previous neck surgery that he took it helped some. His pain level is 10/10 today.     Vitals:   09/15/23 0820  BP: 134/82  Pulse: 70  SpO2: 97%   Vitals:   09/15/23 0820  Weight: 192 lb (87.1 kg)  Height: 6' (1.829 m)   Body mass index is 26.04 kg/m.  DG Lumbar Spine Complete Result Date: 09/17/2023 CLINICAL DATA:  Acute on chronic lumbar pain after hiking. EXAM: LUMBAR SPINE - COMPLETE 4+ VIEW COMPARISON:  None Available. FINDINGS: There is no evidence of lumbar spine fracture. Alignment is normal. Minimal narrow intervertebral spaces at L3-4, L4-5. 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 Electronically Signed   By: Anna Barnes M.D.   On: 09/17/2023 13:20     Independent interpretation of notes and tests performed by another provider:   None  Procedures performed:   None  Pertinent History, Exam, Impression, and Recommendations:   Problem List Items Addressed This Visit     Lumbar back pain with radiculopathy affecting right lower extremity - Primary   History of Present Illness GAROLD Duncan "Chad Duncan" is a 43 year old male who presents with acute low back pain radiating to the right thigh after a recent hike.  He experienced the onset of low back pain two days ago following a hike. The pain is intense, radiating from the low back into the right thigh, both anteriorly and posteriorly, but does not extend past the knee. He describes the  pain as 'like an ice pick' in the lower lumbar region.  He has been using hydrocodone  medication from a previous neck surgery, which provides some relief, but the pain remains significant. He has not taken any anti-inflammatory medications like ibuprofen or Tylenol  due to lack of availability at home and difficulty driving.  The pain is constant regardless of position, but transitions in movement exacerbate it. He has difficulty sleeping, with the most comfortable position being on his back with a heating pad.  He experiences weakness in the right leg when moving in certain ways, and pain limits his ability to exert full strength during hip flexion. No numbness in the saddle area, no bowel or bladder insensate problems endorsed.  Physical Exam PALPATION: Positive Kemps test, tenderness localized to lower lumbar paraspinal region. STRENGTH: Resisted hip flexion 5/5 strength left, 4/5 strength right limited by pain. Knee flexion 5/5 strength bilaterally. Right knee extension possibly 5-/5 strength. SPECIAL TESTS: Negative straight leg raise on the right and left. NEUROVASCULAR: Sensorimotor intact.   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 Increased urinary frequency noted. Differential includes nerve involvement from lumbar spine arthritis. - Order lumbar spine x-ray to assess for arthritis. - Prescribe prednisone  50 mg twice daily for 5 days. - Prescribe Flexeril  up to three times  daily as needed. - Advise heat for muscle relaxation, ice for inflammation. - Instruct on gentle motion exercises after rest, avoid strenuous activity. - Advise alternating sitting and standing every 30-45 minutes. - Prescribe diclofenac  twice daily with food post-prednisone . - Provide work note for two weeks off. - Refer to [facility] if symptoms  persist. - Advise direct contact with neurosurgery group for appointment.      Relevant Medications   predniSONE  (DELTASONE ) 50 MG tablet   oxyCODONE -acetaminophen  (PERCOCET) 7.5-325 MG tablet   diclofenac  (VOLTAREN ) 75 MG EC tablet   cyclobenzaprine  (FLEXERIL ) 10 MG tablet   Other Relevant Orders   DG Lumbar Spine Complete (Completed)     Orders & Medications Medications:  Meds ordered this encounter  Medications   predniSONE  (DELTASONE ) 50 MG tablet    Sig: Take 1 tablet (50 mg total) by mouth 2 (two) times daily with a meal for 5 days.    Dispense:  10 tablet    Refill:  0   oxyCODONE -acetaminophen  (PERCOCET) 7.5-325 MG tablet    Sig: Take 1 tablet by mouth every 8 (eight) hours as needed for up to 5 days.    Dispense:  15 tablet    Refill:  0   diclofenac  (VOLTAREN ) 75 MG EC tablet    Sig: Take 1 tablet (75 mg total) by mouth 2 (two) times daily.    Dispense:  30 tablet    Refill:  0   cyclobenzaprine  (FLEXERIL ) 10 MG tablet    Sig: Take 1 tablet (10 mg total) by mouth 3 (three) times daily as needed for muscle spasms.    Dispense:  30 tablet    Refill:  1   Orders Placed This Encounter  Procedures   DG Lumbar Spine Complete     No follow-ups on file.     Ma Saupe, MD, [facility]   Primary Care Sports Medicine Primary Care and Sports Medicine at [facility]

## 2023-09-17 NOTE — Patient Instructions (Signed)
 Patient Plan  Acute on Chronic Low Back Pain with Radiculopathy:  1. Take prednisone  50 mg twice daily for 5 days. 2. Use Flexeril  up to three times daily as needed for muscle relaxation. 3. Apply heat to relax muscles and ice to reduce inflammation. 4. Perform gentle motion exercises after resting, and avoid strenuous activities. 5. Alternate between sitting and standing every 30-45 minutes. 6. After completing prednisone , take diclofenac  twice daily with food. 7. A lumbar spine x-ray has been ordered to check for arthritis. 8. Take two weeks off work; a note has been provided. 9. If symptoms persist, contact [facility] directly for an appointment.  Red Flags:  - Seek medical attention if you experience new or worsening numbness, weakness, or changes in bladder or bowel function.

## 2023-09-19 ENCOUNTER — Encounter: Payer: Self-pay | Admitting: Family Medicine

## 2023-09-19 ENCOUNTER — Other Ambulatory Visit: Payer: Self-pay | Admitting: Family Medicine

## 2023-09-29 NOTE — Telephone Encounter (Signed)
 Please review patient message from last week.  JM

## 2023-10-01 ENCOUNTER — Other Ambulatory Visit: Payer: Self-pay | Admitting: Family Medicine

## 2023-10-01 DIAGNOSIS — I1A0 Resistant hypertension: Secondary | ICD-10-CM

## 2023-10-02 NOTE — Telephone Encounter (Signed)
 Requested medication (s) are due for refill today - yes  Requested medication (s) are on the active medication list -yes  Future visit scheduled -yes  Last refill: 07/06/23 #90  Notes to clinic: fails lab protocol- over 1 year- 05/08/22  Requested Prescriptions  Pending Prescriptions Disp Refills   hydrochlorothiazide  (HYDRODIURIL ) 25 MG tablet [Pharmacy Med Name: HYDROCHLOROTHIAZIDE  25 MG TAB] 90 tablet 0    Sig: TAKE 1 TABLET (25 MG TOTAL) BY MOUTH DAILY.     Cardiovascular: Diuretics - Thiazide Failed - 10/02/2023 11:59 AM      Failed - Cr in normal range and within 180 days    Creatinine  Date Value Ref Range Status  12/21/2012 1.13 0.60 - 1.30 mg/dL Final   Creatinine, Ser  Date Value Ref Range Status  05/08/2022 0.99 0.76 - 1.27 mg/dL Final         Failed - K in normal range and within 180 days    Potassium  Date Value Ref Range Status  05/08/2022 4.5 3.5 - 5.2 mmol/L Final  12/21/2012 3.5 3.5 - 5.1 mmol/L Final         Failed - Na in normal range and within 180 days    Sodium  Date Value Ref Range Status  05/08/2022 140 134 - 144 mmol/L Final  12/21/2012 140 136 - 145 mmol/L Final         Failed - Valid encounter within last 6 months    Recent Outpatient Visits           2 weeks ago Lumbar back pain with radiculopathy affecting right lower extremity   [place] [facility] Augustus Ledger, Dessie Flow, MD       Future Appointments             In 8 months Augustus Ledger Dessie Flow, MD [facility] & [facility] Medicine at [facility]            Passed - Last BP in normal range    BP Readings from Last 1 Encounters:  09/15/23 134/82            Requested Prescriptions  Pending Prescriptions Disp Refills   hydrochlorothiazide  (HYDRODIURIL ) 25 MG tablet [Pharmacy Med Name: HYDROCHLOROTHIAZIDE  25 MG TAB] 90 tablet 0    Sig: TAKE 1 TABLET (25 MG TOTAL) BY MOUTH DAILY.     Cardiovascular: Diuretics -  Thiazide Failed - 10/02/2023 11:59 AM      Failed - Cr in normal range and within 180 days    Creatinine  Date Value Ref Range Status  12/21/2012 1.13 0.60 - 1.30 mg/dL Final   Creatinine, Ser  Date Value Ref Range Status  05/08/2022 0.99 0.76 - 1.27 mg/dL Final         Failed - K in normal range and within 180 days    Potassium  Date Value Ref Range Status  05/08/2022 4.5 3.5 - 5.2 mmol/L Final  12/21/2012 3.5 3.5 - 5.1 mmol/L Final         Failed - Na in normal range and within 180 days    Sodium  Date Value Ref Range Status  05/08/2022 140 134 - 144 mmol/L Final  12/21/2012 140 136 - 145 mmol/L Final         Failed - Valid encounter within last 6 months    Recent Outpatient Visits           2 weeks ago Lumbar back pain with  radiculopathy affecting right lower extremity   [place] [facility] at [facility], Dessie Flow, MD       Future Appointments             In 8 months Augustus Ledger, Dessie Flow, MD [facility] Primary [facility] & Sports Medicine at [facility]            Passed - Last BP in normal range    BP Readings from Last 1 Encounters:  09/15/23 134/82

## 2023-10-06 ENCOUNTER — Telehealth: Payer: Self-pay | Admitting: Family Medicine

## 2023-10-06 ENCOUNTER — Encounter: Payer: Self-pay | Admitting: Family Medicine

## 2023-10-06 DIAGNOSIS — M5416 Radiculopathy, lumbar region: Secondary | ICD-10-CM

## 2023-10-06 NOTE — Telephone Encounter (Signed)
    Primary Care / Sports Medicine Telephone Note  Patient Information:  Patient ID: Chad Duncan, male DOB: 1980-11-07 Age: 43 y.o. MRN: [id number]    Spoke to patient today, symptoms have significantly improved, no weakness, back to work without issues. I completed the FMLA paperwork and he is amenable to starting physical therapy.  [facility]:  [place]:  [phone]  : [phone]  Can follow-up as-needed.  Ma Saupe, MD, [facility]   Primary Care Sports Medicine Primary Care and Sports Medicine at [facility]

## 2023-12-21 ENCOUNTER — Encounter: Payer: Self-pay | Admitting: Family Medicine

## 2023-12-21 NOTE — Telephone Encounter (Signed)
 Please review patient questions and advise.  JM

## 2023-12-25 ENCOUNTER — Other Ambulatory Visit: Payer: Self-pay

## 2023-12-25 DIAGNOSIS — I1A0 Resistant hypertension: Secondary | ICD-10-CM

## 2023-12-25 MED ORDER — HYDROCHLOROTHIAZIDE 25 MG PO TABS: 25.0000 mg | ORAL_TABLET | Freq: Every day | ORAL | 0 refills | Status: DC

## 2023-12-25 MED ORDER — LISINOPRIL 40 MG PO TABS: 40.0000 mg | ORAL_TABLET | Freq: Every day | ORAL | 0 refills | Status: DC

## 2023-12-25 MED ORDER — DILTIAZEM HCL ER COATED BEADS 180 MG PO CP24: 180.0000 mg | ORAL_CAPSULE | Freq: Every day | ORAL | 0 refills | Status: DC

## 2024-03-20 ENCOUNTER — Other Ambulatory Visit: Payer: Self-pay | Admitting: Family Medicine

## 2024-03-20 DIAGNOSIS — I1A0 Resistant hypertension: Secondary | ICD-10-CM

## 2024-03-22 NOTE — Telephone Encounter (Signed)
 Requested Prescriptions  Pending Prescriptions Disp Refills   hydrochlorothiazide  (HYDRODIURIL ) 25 MG tablet [Pharmacy Med Name: HYDROCHLOROTHIAZIDE  25 MG TAB] 90 tablet 0    Sig: TAKE 1 TABLET (25 MG TOTAL) BY MOUTH DAILY.     Cardiovascular: Diuretics - Thiazide Failed - 03/22/2024 12:07 PM      Failed - Cr in normal range and within 180 days    Creatinine  Date Value Ref Range Status  12/21/2012 1.13 0.60 - 1.30 mg/dL Final   Creatinine, Ser  Date Value Ref Range Status  05/08/2022 0.99 0.76 - 1.27 mg/dL Final         Failed - K in normal range and within 180 days    Potassium  Date Value Ref Range Status  05/08/2022 4.5 3.5 - 5.2 mmol/L Final  12/21/2012 3.5 3.5 - 5.1 mmol/L Final         Failed - Na in normal range and within 180 days    Sodium  Date Value Ref Range Status  05/08/2022 140 134 - 144 mmol/L Final  12/21/2012 140 136 - 145 mmol/L Final         Failed - Valid encounter within last 6 months    Recent Outpatient Visits           6 months ago Lumbar back pain with radiculopathy affecting right lower extremity   [place] [facility], Selinda PARAS, MD       Future Appointments             In 2 months Ku Selinda PARAS, MD [facility] Medicine at [facility], [phone] [place]            Passed - Last BP in normal range    BP Readings from Last 1 Encounters:  09/15/23 134/82          lisinopril  (ZESTRIL ) 40 MG tablet [Pharmacy Med Name: LISINOPRIL  40 MG TABLET] 90 tablet 0    Sig: TAKE 1 TABLET BY MOUTH EVERY DAY     Cardiovascular:  ACE Inhibitors Failed - 03/22/2024 12:07 PM      Failed - Cr in normal range and within 180 days    Creatinine  Date Value Ref Range Status  12/21/2012 1.13 0.60 - 1.30 mg/dL Final   Creatinine, Ser  Date Value Ref Range Status  05/08/2022 0.99 0.76 - 1.27 mg/dL Final         Failed - K in normal range and within 180 days    Potassium   Date Value Ref Range Status  05/08/2022 4.5 3.5 - 5.2 mmol/L Final  12/21/2012 3.5 3.5 - 5.1 mmol/L Final         Failed - Valid encounter within last 6 months    Recent Outpatient Visits           6 months ago Lumbar back pain with radiculopathy affecting right lower extremity   [place] [facility], Selinda PARAS, MD       Future Appointments             In 2 months Ku Selinda PARAS, MD [facility] Primary Care & Sports Medicine at [facility], [phone] [place]            Passed - Patient is not pregnant      Passed - Last BP in normal range    BP Readings from Last 1 Encounters:  09/15/23 134/82          diltiazem  (CARDIZEM  CD) 180 MG 24 hr capsule [Pharmacy Med Name: DILTIAZEM  24H ER(CD) 180 MG CP] 90 capsule 0    Sig: TAKE 1 CAPSULE BY MOUTH EVERY DAY     Cardiovascular: Calcium  Channel Blockers 3 Failed - 03/22/2024 12:07 PM      Failed - ALT in normal range and within 360 days    ALT  Date Value Ref Range Status  05/08/2022 24 0 - 44 IU/L Final   SGPT (ALT)  Date Value Ref Range Status  12/21/2012 24 12 - 78 U/L Final         Failed - AST in normal range and within 360 days    AST  Date Value Ref Range Status  05/08/2022 18 0 - 40 IU/L Final   SGOT(AST)  Date Value Ref Range Status  12/21/2012 20 15 - 37 Unit/L Final         Failed - Cr in normal range and within 360 days    Creatinine  Date Value Ref Range Status  12/21/2012 1.13 0.60 - 1.30 mg/dL Final   Creatinine, Ser  Date Value Ref Range Status  05/08/2022 0.99 0.76 - 1.27 mg/dL Final         Failed - Valid encounter within last 6 months    Recent Outpatient Visits           6 months ago Lumbar back pain with radiculopathy affecting right lower extremity   [place] [facility] [place] Alvia, Selinda PARAS, MD       Future Appointments             In 2 months Alvia Selinda PARAS, MD [facility] Primary  Care & Sports Medicine at [facility], [phone] [place]            Passed - Last BP in normal range    BP Readings from Last 1 Encounters:  09/15/23 134/82         Passed - Last Heart Rate in normal range    Pulse Readings from Last 1 Encounters:  09/15/23 70

## 2024-05-02 ENCOUNTER — Ambulatory Visit: Admitting: Family Medicine

## 2024-05-02 ENCOUNTER — Encounter: Payer: Self-pay | Admitting: Family Medicine

## 2024-05-02 VITALS — BP 158/96 | HR 86 | Ht 72.0 in | Wt 192.0 lb

## 2024-05-02 DIAGNOSIS — J209 Acute bronchitis, unspecified: Secondary | ICD-10-CM | POA: Insufficient documentation

## 2024-05-02 MED ORDER — IPRATROPIUM-ALBUTEROL 20-100 MCG/ACT IN AERS: 1.0000 | INHALATION_SPRAY | Freq: Four times a day (QID) | RESPIRATORY_TRACT | 0 refills | Status: AC

## 2024-05-02 MED ORDER — METHYLPREDNISOLONE 4 MG PO TBPK: ORAL_TABLET | ORAL | 0 refills | Status: DC

## 2024-05-02 MED ORDER — AZITHROMYCIN 250 MG PO TABS: ORAL_TABLET | ORAL | 0 refills | Status: AC

## 2024-05-02 MED ORDER — PROMETHAZINE-DM 6.25-15 MG/5ML PO SYRP: 5.0000 mL | ORAL_SOLUTION | Freq: Four times a day (QID) | ORAL | 0 refills | Status: DC | PRN

## 2024-05-02 NOTE — Progress Notes (Signed)
 Primary Care / Sports Medicine Office Visit  Patient Information:  Patient ID: Chad Duncan, male DOB: [birth year] Age: 43 y.o. MRN: [id number]   Chad Duncan is a pleasant 43 y.o. male presenting with the following:  Chief Complaint  Patient presents with   Cough    Continued cough after flu. Patient has been taking OTC cough medication which has not helped. Cough is the worst in the mornings and at night. He occasionally has cough and can't stop the cough. Patient's lungs are sore from coughing.     Vitals:   05/02/24 1512 05/02/24 1518  BP: (!) 164/100 (!) 158/96  Pulse: 86   SpO2: 97%    Vitals:   05/02/24 1512  Weight: 192 lb (87.1 kg)  Height: 6' (1.829 m)   Body mass index is 26.04 kg/m.  No results found.   Discussed the use of AI scribe software for clinical note transcription with the patient, who gave verbal consent to proceed.   Independent interpretation of notes and tests performed by another provider:   None  Procedures performed:   None  Pertinent History, Exam, Impression, and Recommendations:   History of Present Illness Chad Duncan is a 43 year old male who presents with a persistent cough following a recent flu-like illness.  Fever and flu-like illness - Acute onset of severe fever on April 23, 2024, described as intense and debilitating - Fever persisted until April 27, 2024 - Initial illness characterized as flu-like  Cough and sputum production - Persistent cough developed after resolution of fever - Initial cough productive of green and brown sputum - Cough has transitioned to a dry, wheezy character - Cough is worse in the mornings and at night, causing awakening from sleep  Wheezing and respiratory sensations - Wheezing present, particularly with cough - Sensation of a 'rock' or 'palm on my lung' - Episodes of feeling like 'gasping for air'  Symptom trajectory and impact - Returned to work on April 28, 2024, but  experienced resurgence of symptoms over the weekend - Persistent symptoms have impacted sleep and daily activities  Response to treatment - Trial of over-the-counter cough medicine without significant improvement  Physical Exam HEENT: Oral cavity normal. Nasal passages normal. Ears normal. CHEST: Lungs initially with diffuse wheeze and mildly diminished AE, clear to auscultation bilaterally post DuoNeb CARDIOVASCULAR: Heart sounds normal.  Assessment and Plan Acute bronchitis Likely secondary to recent viral infection with persistent cough, wheezing, and chest discomfort. - Administered DuoNeb via nebulizer. - Prescribed steroids for airway inflammation. - Prescribed promethazine  DM for cough. - Prescribed antibiotics if no improvement by mid-week. - Provided inhaler for breathing treatments.  Problem List Items Addressed This Visit     Acute bronchitis - Primary     Orders & Medications Medications:  Meds ordered this encounter  Medications   Ipratropium-Albuterol  (COMBIVENT) 20-100 MCG/ACT AERS respimat    Sig: Inhale 1 puff into the lungs every 6 (six) hours.    Dispense:  1 each    Refill:  0   promethazine -dextromethorphan (PROMETHAZINE -DM) 6.25-15 MG/5ML syrup    Sig: Take 5 mLs by mouth 4 (four) times daily as needed for cough.    Dispense:  118 mL    Refill:  0   methylPREDNISolone  (MEDROL  DOSEPAK) 4 MG TBPK tablet    Sig: Use as directed.    Dispense:  21 each    Refill:  0   azithromycin  (ZITHROMAX ) 250 MG tablet  Sig: Take 2 tablets on day 1, then 1 tablet daily on days 2 through 5    Dispense:  6 tablet    Refill:  0   No orders of the defined types were placed in this encounter.    No follow-ups on file.     Selinda JINNY Ku, MD, [facility]   Primary Care Sports Medicine Primary Care and Sports Medicine at [facility] [place]

## 2024-05-02 NOTE — Patient Instructions (Signed)
 VISIT SUMMARY:  You came in today because of a persistent cough following a recent flu-like illness. Your symptoms include a dry, wheezy cough, chest discomfort, and episodes of feeling like you are gasping for air. We have started treatment to help manage these symptoms.  YOUR PLAN:  ACUTE BRONCHITIS: You have acute bronchitis likely caused by a recent viral infection. This is causing your persistent cough, wheezing, and chest discomfort. -You received a DuoNeb treatment via nebulizer today to help open your airways. -You have been prescribed steroids to reduce airway inflammation. Please take them as directed. -You have been prescribed promethazine  DM to help control your cough. Please take it as directed. -If your symptoms do not improve by mid-week, start taking the prescribed antibiotics. -Use the inhaler provided for breathing treatments as needed.

## 2024-05-30 ENCOUNTER — Ambulatory Visit (INDEPENDENT_AMBULATORY_CARE_PROVIDER_SITE_OTHER): Payer: Self-pay | Admitting: Family Medicine

## 2024-05-30 ENCOUNTER — Encounter: Payer: Self-pay | Admitting: Family Medicine

## 2024-05-30 VITALS — BP 128/80 | HR 70 | Ht 72.0 in | Wt 195.0 lb

## 2024-05-30 DIAGNOSIS — L649 Androgenic alopecia, unspecified: Secondary | ICD-10-CM | POA: Insufficient documentation

## 2024-05-30 DIAGNOSIS — N400 Enlarged prostate without lower urinary tract symptoms: Secondary | ICD-10-CM | POA: Diagnosis not present

## 2024-05-30 DIAGNOSIS — F419 Anxiety disorder, unspecified: Secondary | ICD-10-CM | POA: Insufficient documentation

## 2024-05-30 DIAGNOSIS — I1A0 Resistant hypertension: Secondary | ICD-10-CM | POA: Diagnosis not present

## 2024-05-30 DIAGNOSIS — Z0000 Encounter for general adult medical examination without abnormal findings: Secondary | ICD-10-CM | POA: Diagnosis not present

## 2024-05-30 MED ORDER — DILTIAZEM HCL ER COATED BEADS 180 MG PO CP24: 180.0000 mg | ORAL_CAPSULE | Freq: Every day | ORAL | 1 refills | Status: AC

## 2024-05-30 MED ORDER — HYDROCHLOROTHIAZIDE 25 MG PO TABS: 25.0000 mg | ORAL_TABLET | Freq: Every day | ORAL | 1 refills | Status: AC

## 2024-05-30 MED ORDER — MINOXIDIL 5 % EX FOAM: CUTANEOUS | 5 refills | Status: AC

## 2024-05-30 MED ORDER — LISINOPRIL 40 MG PO TABS: 40.0000 mg | ORAL_TABLET | Freq: Every day | ORAL | 1 refills | Status: AC

## 2024-05-30 NOTE — Patient Instructions (Addendum)
-   Obtain fasting labs with orders provided (can have water or black coffee but otherwise no food or drink x 8 hours before labs) - Review information provided - Attend eye doctor annually, dentist every 6 months, work towards or maintain 30 minutes of moderate intensity physical activity at least 5 days per week, and consume a balanced diet - Return in 1 year for physical - Contact us  for any questions between now and then   VISIT SUMMARY:  During your visit, we reviewed your hypertension management, recent respiratory illness, right knee injury, and androgenic alopecia. We also discussed preventive care and general health maintenance.  YOUR PLAN:  HYPERTENSION: Your hypertension is well-controlled with your current medications. -Continue taking Cardizem , hydrochlorothiazide , and lisinopril  as prescribed. -Refilled your prescriptions for six months. -Follow up in six months for re-evaluation. -Maintain lifestyle changes including stress management, good sleep hygiene, reducing sodium intake, and staying physically active. -We ordered a metabolic panel, glucose, cholesterol, and prostate screening.  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 -Start using topical minoxidil  as prescribed. -Expect to see results in 6 months. -Be aware of potential side effects like transient lightheadedness. -You can use over-the-counter minoxidil  if it is not covered by your insurance. -Oral finasteride is a secondary option, but handle with care. -Follow up in six months to assess your response to the treatment.  PREVENTIVE CARE: You requested a referral for a routine eye examination. -We will provide a referral for an optometry evaluation.

## 2024-05-30 NOTE — Progress Notes (Signed)
 "    Annual Physical Exam Visit  Patient Information:  Patient ID: Chad Duncan, male DOB: 1981-02-20 Age: 44 y.o. MRN: [id number]   Subjective:   CC: Annual Physical Exam  HPI:  Chad Duncan is here for their annual physical.  I reviewed the past medical history, family history, social history, surgical history, and allergies today and changes were made as necessary.  Please see the problem list section below for additional details.  Past Medical History: Past Medical History:  Diagnosis Date   Anxiety    Healthcare maintenance 05/30/2024   Herniated disc, cervical    Hypertension 05/08/2022   Past Surgical History: Past Surgical History:  Procedure Laterality Date   CERVICAL FUSION     C6-C7   SPINE SURGERY  2018   C6-c7 fusion   Family History: Family History  Problem Relation Age of Onset   Healthy Mother    Hypertension Father    Allergies: Allergies[1] Health Maintenance: Health Maintenance  Topic Date Due   Influenza Vaccine  08/16/2024 (Originally 12/18/2023)   COVID-19 Vaccine (1) 05/24/2025 (Originally 07/17/1981)   DTaP/Tdap/Td (1 - Tdap) 05/30/2025 (Originally 01/15/2002)   Pneumococcal Vaccine (1 of 2 - PCV) 05/30/2025 (Originally 01/15/2002)   Hepatitis B Vaccines 19-59 Average Risk (1 of 3 - 19+ 3-dose series) 05/30/2025 (Originally 01/15/2002)   HPV VACCINES (1 - Risk 3-dose SCDM series) 05/30/2025 (Originally 01/15/2010)   Hepatitis C Screening  Completed   HIV Screening  Completed   Meningococcal B Vaccine  Aged Out    HM Colonoscopy   This patient has no relevant Health Maintenance data.    Medications: Medications Ordered Prior to Encounter[2]  Discussed the use of AI scribe software for clinical note transcription with the patient, who gave verbal consent to proceed.   Objective:   Vitals:   05/30/24 0754  BP: 128/80  Pulse: 70  SpO2: 97%   Vitals:   05/30/24 0754  Weight: 195 lb (88.5 kg)  Height: 6' (1.829 m)   Body mass index is  26.45 kg/m.  General: Well Developed, well nourished, and in no acute distress.  Neuro: Alert and oriented x3, extra-ocular muscles intact, sensation grossly intact. Cranial nerves II through XII are grossly intact, motor, sensory, and coordinative functions are intact. HEENT: Normocephalic, atraumatic, neck supple, no masses, no lymphadenopathy, thyroid  nonenlarged. Oropharynx, nasopharynx, external ear canals are unremarkable. Skin: Warm and dry, no rashes noted.  Cardiac: Regular rate and rhythm, no murmurs rubs or gallops. No peripheral edema. Pulses symmetric. Respiratory: Clear to auscultation bilaterally. Speaking in full sentences.  Abdominal: Soft, nontender, nondistended, positive bowel sounds, no masses, no organomegaly. Musculoskeletal: Stable, and with full range of motion.  Impression and Recommendations:   History of Present Illness Chad Duncan is a 44 year old male with resistant hypertension who presents for routine physical and follow-up evaluation.  Hypertension Management: - Currently taking Cardizem , hydrochlorothiazide , and lisinopril  - No adverse effects or complications from antihypertensive regimen  Recent Respiratory Illness: - Experienced an episode of bronchitis prior to last visit on December 15th, now resolved - No recurrence of cough, dyspnea, or wheezing - Previously used DuoNebs via nebulizer and a course of corticosteroids with good effect - Discontinued Combivent inhaler due to difficulty with administration - Did not require antibiotics - No further respiratory interventions needed  Right Knee Injury: - Sustained direct blow to right knee while snowboarding during Christmas break - Developed severe pain several hours post-injury - Pain resolved by the  following day after abstaining from activity for one day and using topical menthol (Biofreeze) - No recurrence of pain, swelling, instability, or limitation in activity since  Androgenic  Alopecia: - Interested in treatment options, including topical minoxidil  and oral agents  Preventive Care: - Requests referral for routine ophthalmologic evaluation, as he has not had a recent eye examination  General Health: - Remains physically active, especially during winter months - No new symptoms or concerns  Assessment and Plan Hypertension Hypertension controlled on current regimen with Cardizem , HCTZ, and lisinopril . Blood pressure at target. - Refilled Cardizem , HCTZ, and lisinopril  for six months. - Scheduled six-month follow-up. - Provided guidance on lifestyle, stress management, sleep hygiene, sodium restriction, and physical activity. - Ordered metabolic panel, glucose, cholesterol, and prostate screening.  Androgenic alopecia Androgenic alopecia present. Recommended topical minoxidil  as first-line therapy. Discussed treatment timeline, mechanism, outcomes, and side effects. Considered oral finasteride as secondary option. - Prescribed topical minoxidil . - Educated on treatment timeline (6 months), mechanism, and outcomes. - Reviewed side effects, including transient lightheadedness. - Advised over-the-counter minoxidil  as alternative if not covered by insurance. - Discussed oral finasteride as secondary option with handling precautions. - Scheduled six-month follow-up to assess therapy response.  The patient was counselled, risk factors were discussed, and anticipatory guidance given.  Problem List Items Addressed This Visit     Anxiety   Healthcare maintenance - Primary   Relevant Orders   CBC   Comprehensive metabolic panel with GFR   Hemoglobin A1c   Lipid panel   PSA Total (Reflex To Free)   Hypertension   Relevant Medications   diltiazem  (CARDIZEM  CD) 180 MG 24 hr capsule   hydrochlorothiazide  (HYDRODIURIL ) 25 MG tablet   lisinopril  (ZESTRIL ) 40 MG tablet   Other Relevant Orders   Comprehensive metabolic panel with GFR   Lipid panel   Male pattern  baldness   Relevant Medications   Minoxidil  5 % FOAM   Other Visit Diagnoses       Benign prostatic hyperplasia, unspecified whether lower urinary tract symptoms present       Relevant Orders   PSA Total (Reflex To Free)        Orders & Medications Medications:  Meds ordered this encounter  Medications   diltiazem  (CARDIZEM  CD) 180 MG 24 hr capsule    Sig: Take 1 capsule (180 mg total) by mouth daily.    Dispense:  90 capsule    Refill:  1   hydrochlorothiazide  (HYDRODIURIL ) 25 MG tablet    Sig: Take 1 tablet (25 mg total) by mouth daily.    Dispense:  90 tablet    Refill:  1   lisinopril  (ZESTRIL ) 40 MG tablet    Sig: Take 1 tablet (40 mg total) by mouth daily.    Dispense:  90 tablet    Refill:  1   Minoxidil  5 % FOAM    Sig: Foam, aerosol 5%: Apply 1/2 capful topically twice daily.    Dispense:  60 g    Refill:  5   Orders Placed This Encounter  Procedures   CBC   Comprehensive metabolic panel with GFR   Hemoglobin A1c   Lipid panel   PSA Total (Reflex To Free)     No follow-ups on file.    Selinda JINNY Ku, MD, CAQSM   [facility]      [1]  Allergies Allergen Reactions   Iohexol Shortness Of Breath  Redness all over body mostly face and neck.  [2]  Current Outpatient Medications on File Prior to Visit  Medication Sig Dispense Refill   cyclobenzaprine  (FLEXERIL ) 10 MG tablet Take 1 tablet (10 mg total) by mouth 3 (three) times daily as needed for muscle spasms. 30 tablet 1   diazepam  (VALIUM ) 2 MG tablet Take 1 tablet (2 mg total) by mouth every 8 (eight) hours as needed for anxiety. 30 tablet 0   Ipratropium-Albuterol  (COMBIVENT) 20-100 MCG/ACT AERS respimat Inhale 1 puff into the lungs every 6 (six) hours. 1 each 0   No current facility-administered medications on file prior to visit.   "

## 2024-05-31 ENCOUNTER — Ambulatory Visit: Payer: Self-pay | Admitting: Family Medicine

## 2024-05-31 LAB — LIPID PANEL
Chol/HDL Ratio: 2.9 ratio (ref 0.0–5.0)
Cholesterol, Total: 170 mg/dL (ref 100–199)
HDL: 58 mg/dL
LDL Chol Calc (NIH): 78 mg/dL (ref 0–99)
Triglycerides: 208 mg/dL — ABNORMAL HIGH (ref 0–149)
VLDL Cholesterol Cal: 34 mg/dL (ref 5–40)

## 2024-05-31 LAB — CBC
Hematocrit: 47.8 % (ref 37.5–51.0)
Hemoglobin: 16.5 g/dL (ref 13.0–17.7)
MCH: 33.1 pg — ABNORMAL HIGH (ref 26.6–33.0)
MCHC: 34.5 g/dL (ref 31.5–35.7)
MCV: 96 fL (ref 79–97)
Platelets: 202 x10E3/uL (ref 150–450)
RBC: 4.98 x10E6/uL (ref 4.14–5.80)
RDW: 11.9 % (ref 11.6–15.4)
WBC: 5.9 x10E3/uL (ref 3.4–10.8)

## 2024-05-31 LAB — COMPREHENSIVE METABOLIC PANEL WITH GFR
ALT: 23 IU/L (ref 0–44)
AST: 18 IU/L (ref 0–40)
Albumin: 4.7 g/dL (ref 4.1–5.1)
Alkaline Phosphatase: 66 IU/L (ref 47–123)
BUN/Creatinine Ratio: 12 (ref 9–20)
BUN: 12 mg/dL (ref 6–24)
Bilirubin Total: 0.3 mg/dL (ref 0.0–1.2)
CO2: 22 mmol/L (ref 20–29)
Calcium: 9.7 mg/dL (ref 8.7–10.2)
Chloride: 100 mmol/L (ref 96–106)
Creatinine, Ser: 1.01 mg/dL (ref 0.76–1.27)
Globulin, Total: 2.3 g/dL (ref 1.5–4.5)
Glucose: 99 mg/dL (ref 70–99)
Potassium: 4.3 mmol/L (ref 3.5–5.2)
Sodium: 138 mmol/L (ref 134–144)
Total Protein: 7 g/dL (ref 6.0–8.5)
eGFR: 96 mL/min/1.73

## 2024-05-31 LAB — PSA TOTAL (REFLEX TO FREE): Prostate Specific Ag, Serum: 0.9 ng/mL (ref 0.0–4.0)

## 2024-05-31 LAB — HEMOGLOBIN A1C
Est. average glucose Bld gHb Est-mCnc: 105 mg/dL
Hgb A1c MFr Bld: 5.3 % (ref 4.8–5.6)

## 2024-11-28 ENCOUNTER — Ambulatory Visit: Admitting: Family Medicine
